# Patient Record
Sex: Female | Born: 1975 | Race: Black or African American | Hispanic: No | Marital: Single | Smoking: Never smoker
Health system: Southern US, Community
[De-identification: ages and names within clinical notes are randomized; demographics above are authoritative.]

## PROBLEM LIST (undated history)

## (undated) DIAGNOSIS — K219 Gastro-esophageal reflux disease without esophagitis: Secondary | ICD-10-CM

## (undated) DIAGNOSIS — G4730 Sleep apnea, unspecified: Secondary | ICD-10-CM

## (undated) DIAGNOSIS — I2699 Other pulmonary embolism without acute cor pulmonale: Secondary | ICD-10-CM

## (undated) DIAGNOSIS — B379 Candidiasis, unspecified: Secondary | ICD-10-CM

## (undated) HISTORY — DX: Sleep apnea, unspecified: G47.30

## (undated) HISTORY — DX: Candidiasis, unspecified: B37.9

## (undated) HISTORY — PX: WISDOM TOOTH EXTRACTION: SHX21

## (undated) HISTORY — DX: Gastro-esophageal reflux disease without esophagitis: K21.9

---

## 1998-10-04 ENCOUNTER — Emergency Department (HOSPITAL_COMMUNITY): Admission: EM | Admit: 1998-10-04 | Discharge: 1998-10-04 | Payer: Self-pay | Admitting: *Deleted

## 1999-09-18 ENCOUNTER — Emergency Department (HOSPITAL_COMMUNITY): Admission: EM | Admit: 1999-09-18 | Discharge: 1999-09-18 | Payer: Self-pay | Admitting: Emergency Medicine

## 2000-11-14 ENCOUNTER — Emergency Department (HOSPITAL_COMMUNITY): Admission: EM | Admit: 2000-11-14 | Discharge: 2000-11-14 | Payer: Self-pay | Admitting: Emergency Medicine

## 2000-12-31 ENCOUNTER — Encounter: Payer: Self-pay | Admitting: Emergency Medicine

## 2000-12-31 ENCOUNTER — Emergency Department (HOSPITAL_COMMUNITY): Admission: EM | Admit: 2000-12-31 | Discharge: 2000-12-31 | Payer: Self-pay | Admitting: Emergency Medicine

## 2001-05-21 ENCOUNTER — Emergency Department (HOSPITAL_COMMUNITY): Admission: EM | Admit: 2001-05-21 | Discharge: 2001-05-21 | Payer: Self-pay | Admitting: Emergency Medicine

## 2001-10-14 ENCOUNTER — Emergency Department (HOSPITAL_COMMUNITY): Admission: EM | Admit: 2001-10-14 | Discharge: 2001-10-15 | Payer: Self-pay | Admitting: Emergency Medicine

## 2002-12-01 ENCOUNTER — Inpatient Hospital Stay (HOSPITAL_COMMUNITY): Admission: AD | Admit: 2002-12-01 | Discharge: 2002-12-01 | Payer: Self-pay | Admitting: Obstetrics and Gynecology

## 2003-08-22 ENCOUNTER — Emergency Department (HOSPITAL_COMMUNITY): Admission: EM | Admit: 2003-08-22 | Discharge: 2003-08-22 | Payer: Self-pay | Admitting: Emergency Medicine

## 2003-10-31 ENCOUNTER — Emergency Department (HOSPITAL_COMMUNITY): Admission: EM | Admit: 2003-10-31 | Discharge: 2003-10-31 | Payer: Self-pay | Admitting: Emergency Medicine

## 2003-11-13 ENCOUNTER — Emergency Department (HOSPITAL_COMMUNITY): Admission: EM | Admit: 2003-11-13 | Discharge: 2003-11-14 | Payer: Self-pay | Admitting: Emergency Medicine

## 2004-06-28 ENCOUNTER — Emergency Department (HOSPITAL_COMMUNITY): Admission: EM | Admit: 2004-06-28 | Discharge: 2004-06-28 | Payer: Self-pay | Admitting: Emergency Medicine

## 2008-01-19 ENCOUNTER — Emergency Department (HOSPITAL_COMMUNITY): Admission: EM | Admit: 2008-01-19 | Discharge: 2008-01-19 | Payer: Self-pay | Admitting: Family Medicine

## 2008-07-15 ENCOUNTER — Emergency Department (HOSPITAL_COMMUNITY): Admission: EM | Admit: 2008-07-15 | Discharge: 2008-07-15 | Payer: Self-pay | Admitting: Emergency Medicine

## 2009-02-02 ENCOUNTER — Emergency Department (HOSPITAL_COMMUNITY): Admission: EM | Admit: 2009-02-02 | Discharge: 2009-02-02 | Payer: Self-pay | Admitting: Family Medicine

## 2009-02-13 ENCOUNTER — Emergency Department (HOSPITAL_COMMUNITY): Admission: EM | Admit: 2009-02-13 | Discharge: 2009-02-13 | Payer: Self-pay | Admitting: Emergency Medicine

## 2010-08-28 ENCOUNTER — Inpatient Hospital Stay (HOSPITAL_COMMUNITY)
Admission: AD | Admit: 2010-08-28 | Discharge: 2010-08-28 | Disposition: A | Discharge status: Home / Self Care | Payer: Managed Care, Other (non HMO) | Source: Ambulatory Visit | Attending: Obstetrics and Gynecology | Admitting: Obstetrics and Gynecology

## 2010-08-28 DIAGNOSIS — B3731 Acute candidiasis of vulva and vagina: Secondary | ICD-10-CM

## 2010-08-28 DIAGNOSIS — B373 Candidiasis of vulva and vagina: Secondary | ICD-10-CM

## 2010-08-28 DIAGNOSIS — N949 Unspecified condition associated with female genital organs and menstrual cycle: Secondary | ICD-10-CM | POA: Insufficient documentation

## 2010-08-28 MED ORDER — FLUCONAZOLE 150 MG PO TABS: 150.0000 mg | ORAL_TABLET | Freq: Once | ORAL | Status: AC

## 2010-08-28 NOTE — Progress Notes (Signed)
By Lilyan Punt NP

## 2010-08-28 NOTE — Progress Notes (Signed)
Pt has been with current partner past 6 mos.  Everytime they have intercourse, they are using condoms, and she has been seen and treated 5 times for yeast and BV.  Sees a doctor in [facility].  Currently having same discharge as has been treated before.  This has not occurred with previous partners when using condoms.

## 2010-08-28 NOTE — Progress Notes (Signed)
Vaginal discharge no odor, clumpy in nature patient states "this happens every time has intercourse with her fiance:,

## 2010-08-28 NOTE — ED Provider Notes (Signed)
Chief Complaint  Patient presents with  . Vaginal Discharge   S:  35 yo nulligravida with thick white pruritic vaginal discharge x several days. Last intercourse a wk ago and states over the past 6 months she has had about 5 yeast infections and also has BV tx from her primary NP. Has tried OTC creams without help, but not used with current episode. No hx DM. Had neg GC. CT recently and has had  No new sex partner. Partner asymptomatic for STI.  O: Filed Vitals:   08/28/10 1302  BP: 140/75  Pulse: 74  Temp: 98.4 F (36.9 C)  Resp: 18   No past medical history on file.  Pelvic: NEFG, Vag pink not inflamed, lg amt thick white adherent discharge.  Cx nulliparous,  without lesions No CMT. Uterus NSSP. No adnexal tenderness or masses.   Results for orders placed during the hospital encounter of 08/28/10 (from the past 24 hour(s))  POCT PREGNANCY, URINE     Status: Normal   Collection Time   08/28/10  1:11 PM      Component Value Range   Preg Test, Ur NEGATIVE    POCT PREGNANCY, URINE     Status: Normal   Collection Time   08/28/10  2:04 PM      Component Value Range   Preg Test, Ur NEGATIVE    WET PREP, GENITAL     Status: Abnormal   Collection Time   08/28/10  2:35 PM      Component Value Range   Yeast, Wet Prep NONE SEEN  NONE SEEN    Trich, Wet Prep NONE SEEN  NONE SEEN    Clue Cells, Wet Prep NONE SEEN  NONE SEEN    WBC, Wet Prep HPF POC MODERATE (*) NONE SEEN   GLUCOSE, CAPILLARY     Status: Normal   Collection Time   08/28/10  2:51 PM      Component Value Range   Glucose-Capillary 87  70 - 99 (mg/dL)    A: Clinical yeast infection, recurrent   P: DIflucan rx with refills and discussed measures to acidify vagina - Acigel, boric acid.

## 2011-10-02 ENCOUNTER — Ambulatory Visit (INDEPENDENT_AMBULATORY_CARE_PROVIDER_SITE_OTHER): Payer: Managed Care, Other (non HMO) | Admitting: Obstetrics and Gynecology

## 2011-10-02 ENCOUNTER — Encounter: Payer: Self-pay | Admitting: Obstetrics and Gynecology

## 2011-10-02 VITALS — BP 118/78 | Wt 213.0 lb

## 2011-10-02 DIAGNOSIS — Z01419 Encounter for gynecological examination (general) (routine) without abnormal findings: Secondary | ICD-10-CM

## 2011-10-02 DIAGNOSIS — R109 Unspecified abdominal pain: Secondary | ICD-10-CM

## 2011-10-02 NOTE — Progress Notes (Signed)
NEW GYNECOLOGIC EXAMINATION  Ms. Theresa Riley is an 36 y.o. female, G2P0020, who presents to the [facility] division of [facility] for a new patient gynecologic examination. She is doing well in general.  Her complains AVW:[id number] cycle last month and dysmenorrhea. .     Pertinent Gynecological History: Patient's last menstrual period was 09/19/2011. Menses: flow is moderate Menarche: 13 Contraception: abstinence DES exposure: unknown Blood transfusions: none Sexually transmitted diseases: The patient denies history of sexually transmitted disease. Previous GYN Procedures: DNC  Last mammogram: not applicable Last pap: normal Date: August 2012. History of Abnormal Pap Smears:  No  Obstetrical History:  Vaginal Deliveries at Term:      0 Preterm Vaginal Deliveries:      0 Cesarean Deliveries at Term:  0 Preterm Cesareans:                 0 Miscarriages:                            0 Abortions:                                  2    Past Medical History  Diagnosis Date  . Yeast infection     Past Surgical History  Procedure Date  . Wisdom tooth extraction     Family History  Problem Relation Age of Onset  . Diabetes Mother     Social History:  reports that she has never smoked. She has never used smokeless tobacco. She reports that she does not drink alcohol or use illicit drugs.  Allergies: No Known Allergies  Medications: none  Review of Systems:  See history of present illness and gynecologic history.  Family History: Non-insulin-dependent diabetes in mother.  Physical Examination:  Blood pressure 118/78, weight 213 lb (96.616 kg), last menstrual period 09/19/2011. There is no height on file to calculate BMI.  General: alert and no distress Resp: clear to auscultation bilaterally Cardio: regular rate and rhythm, S1, S2 normal, no murmur, click, rub or gallop GI: soft, non-tender; bowel sounds normal; no masses,  no  organomegaly Breast: No masses, skin changes, or discharge  External genitalia: normal general appearance Vagina: No masses or lesions, relaxation: no nontender skin protrusion from right vaginal wall. Cervix: Nontender, No lesions Uterus: Normal size, shape, and consistency Adnexa: No masses, nontender Rectovaginal exam:Confirms  No results found for this or any previous visit (from the past 48 hour(s)).  Wet Prep: None  Urine Pregnancy Test: None  Assessment:  Normal female examination  Overweight or obese: Yes   Pelvic relaxation: No  Benign vaginal anomaly  Irregular cycle last month  Dysmenorrhea   Plan:    Return to office in 6 weeks.  Renal scan to document and normal renal collection system because of the possibility that her vaginal lesion may be a mllerian remnant.  Contraception:abstinence   STD screen request:  Yes ; Gonorrhea and Chlamydia  The updated Pap smear screening guidelines were discussed with the patient. The patient requested that I obtain a Pap smear: No.  Kegel exercises discussed: Yes.  Proper diet and regular exercise were reviewed.  Annual mammograms recommended starting at age 6. Proper breast care was discussed.  Screening colonoscopy is recommended beginning at age 33.  Regular health maintenance was reviewed.  Sleep hygiene was discussed.  Adequate calcium and vitamin  D intake was emphasized.  Medications Prescribed:  none  Leonard Schwartz, M.D. 10/02/2011    Vag. Discharge:no Odor:no Fever:no Irreg.Periods:yes Dyspareunia:no Dysuria:no Frequency:no Urgency:no Hematuria:no Kidney stones:no Constipation:no Diarrhea:no Rectal Bleeding: no Vomiting:no Nausea:no Pregnant:yes Fibroids:no Endometriosis:no Hx of Ovarian Cyst:no  Hx IUD:no Hx STD-PID:no Appendectomy:yes Gall Bladder Dz:yes Pt stated her last pap was august 2012 wnl per pt

## 2011-10-03 ENCOUNTER — Other Ambulatory Visit: Payer: Self-pay

## 2011-10-03 ENCOUNTER — Telehealth: Payer: Self-pay

## 2011-10-03 LAB — GC/CHLAMYDIA PROBE AMP, GENITAL: GC Probe Amp, Genital: NEGATIVE

## 2011-10-03 NOTE — Telephone Encounter (Signed)
Radiology suggests that we order a CT scan of the abdomen and pelvis with and without contrast.  Dr. Stefano Gaul

## 2011-10-03 NOTE — Telephone Encounter (Signed)
Per AVS pt needs IVP. [facility] stated they do not do this procedure anymore and that the pt may have a CT done. [facility] does do IVP procedures I made the aware of Ms.Uplinger condition and stated what AVS clinical note read. [facility] stated that they would like for. Dr.Stringer to call And discuss to be sure they preform the correct procedure. Dr.Stringer  will need to speak to radiologist  Dr.Dover @ [phone].

## 2011-10-04 ENCOUNTER — Telehealth: Payer: Self-pay

## 2011-10-04 ENCOUNTER — Other Ambulatory Visit: Payer: Self-pay

## 2011-10-04 DIAGNOSIS — R109 Unspecified abdominal pain: Secondary | ICD-10-CM

## 2011-10-04 DIAGNOSIS — N946 Dysmenorrhea, unspecified: Secondary | ICD-10-CM

## 2011-10-04 NOTE — Telephone Encounter (Signed)
Per AVS after speaking to a radiologist A CT scan would be better. A Future order was placed for a CT abdomen W WO CONTRAST. Appt was scheduled for Thurs 10-05-11 @ 3:15pm. Pt is to pick up contrast die 24hrs before procedure to put in the fridge. and not eat any solid foods 4hrs before procedure. Pt was made aware of appt and stated she may need to change the date. Pt was given [facility] [address] # to R/S appt. Pt voiced understanding.  [facility] CMA

## 2011-10-05 ENCOUNTER — Other Ambulatory Visit: Payer: Self-pay

## 2011-10-05 ENCOUNTER — Other Ambulatory Visit: Payer: Managed Care, Other (non HMO)

## 2011-10-05 DIAGNOSIS — R109 Unspecified abdominal pain: Secondary | ICD-10-CM

## 2011-10-06 ENCOUNTER — Other Ambulatory Visit: Payer: Managed Care, Other (non HMO)

## 2011-10-06 ENCOUNTER — Ambulatory Visit
Admission: RE | Admit: 2011-10-06 | Discharge: 2011-10-06 | Disposition: A | Discharge status: Home / Self Care | Payer: Managed Care, Other (non HMO) | Source: Ambulatory Visit | Attending: Obstetrics and Gynecology | Admitting: Obstetrics and Gynecology

## 2011-10-06 ENCOUNTER — Inpatient Hospital Stay: Admission: RE | Admit: 2011-10-06 | Payer: Managed Care, Other (non HMO) | Source: Ambulatory Visit

## 2011-10-06 ENCOUNTER — Telehealth: Payer: Self-pay

## 2011-10-06 DIAGNOSIS — R109 Unspecified abdominal pain: Secondary | ICD-10-CM

## 2011-10-06 MED ORDER — IOHEXOL 300 MG/ML  SOLN: 125.0000 mL | Freq: Once | INTRAMUSCULAR | Status: AC | PRN

## 2011-10-06 NOTE — Telephone Encounter (Signed)
Marisue Ivan from [facility] called and stated the first order For Theresa Riley CT stated she was pregnant But the second order stated she is not pregnant. I called Ms.Schimek today this am and she confirmed she is NOT pregnant. I called Marisue Ivan back at [facility] and confirmed the the pt is not pregnant.   [facility] CMA

## 2011-10-12 ENCOUNTER — Encounter: Payer: Self-pay | Admitting: Obstetrics and Gynecology

## 2011-10-12 ENCOUNTER — Ambulatory Visit (INDEPENDENT_AMBULATORY_CARE_PROVIDER_SITE_OTHER): Payer: Managed Care, Other (non HMO) | Admitting: Obstetrics and Gynecology

## 2011-10-12 VITALS — BP 104/64 | Ht 65.0 in | Wt 219.0 lb

## 2011-10-12 DIAGNOSIS — N898 Other specified noninflammatory disorders of vagina: Secondary | ICD-10-CM

## 2011-10-12 LAB — POCT WET PREP (WET MOUNT): Bacteria Wet Prep HPF POC: NEGATIVE

## 2011-10-12 NOTE — Progress Notes (Signed)
C/o vag d/c on Mon.  Menses last month came 2wks later.  Filed Vitals:   10/12/11 1612  BP: 104/64   ROS: noncontributory  Pelvic exam:  VULVA: normal appearing vulva with no masses, tenderness or lesions,  VAGINA: normal appearing vagina with normal color and discharge, no lesions, CERVIX: normal appearing cervix without discharge or lesions, light blood on cervix UTERUS: uterus is normal size, shape, consistency and nontender,  ADNEXA: normal adnexa in size, nontender and no masses.  Results for orders placed in visit on 10/02/11  GC/CHLAMYDIA PROBE AMP, GENITAL      Component Value Range   Chlamydia, DNA Probe NEGATIVE     GC Probe Amp, Genital NEGATIVE     Results for orders placed in visit on 10/12/11  POCT WET PREP (WET MOUNT)      Component Value Range   Source Wet Prep POC       WBC, Wet Prep HPF POC neg     Bacteria Wet Prep HPF POC neg     BACTERIA WET PREP MORPHOLOGY POC       Clue Cells Wet Prep HPF POC None     CLUE CELLS WET PREP WHIFF POC Negative Whiff     Yeast Wet Prep HPF POC None     KOH Wet Prep POC       Trichomonas Wet Prep HPF POC neg     pH 4.0      A/P Wet prep - neg Beg of yr for AEX Nl appearing d/c, menses about to start

## 2011-11-15 ENCOUNTER — Encounter: Payer: Self-pay | Admitting: Obstetrics and Gynecology

## 2011-11-15 ENCOUNTER — Ambulatory Visit (INDEPENDENT_AMBULATORY_CARE_PROVIDER_SITE_OTHER): Payer: Managed Care, Other (non HMO) | Admitting: Obstetrics and Gynecology

## 2011-11-15 VITALS — BP 110/78 | Wt 219.0 lb

## 2011-11-15 DIAGNOSIS — R109 Unspecified abdominal pain: Secondary | ICD-10-CM

## 2011-11-15 NOTE — Progress Notes (Signed)
HISTORY OF PRESENT ILLNESS  Ms. Theresa Riley is a 36 y.o. year old female,G2P0020, who presents for a problem visit.   The patient was noted to have a soft-tissue vaginal lesion that was suspicious for mllerian abnormality.  A CT scan showed a normal urinary tract, normal pelvic organs, and normal abdominal organs.  See report below. She was evaluated for vaginal discharge and that has now resolved.  Subjective:  The patient complained of abdominal pain last visit and that has now resolved.  Objective:  BP 110/78  Wt 219 lb (99.338 kg)  LMP 11/06/2011   General: no distress  Exam deferred.  Assessment:  Soft-tissue vaginal lesion that may represent a mllerian abnormality.  Normal urinary tract structures and normal pelvic and abdominal organs.  Improved abdominal pain.  Plan:  Observe her only for now.  Return to office prn if symptoms worsen or fail to improve.   Leonard Schwartz M.D.  11/15/2011 1:51 PM    F/u CT scan RADIOLOGY REPORT*  Clinical Data: Lower abdomen pain for 1 month  CT ABDOMEN AND PELVIS WITH CONTRAST  Technique: Multidetector CT imaging of the abdomen and pelvis was  performed following the standard protocol during bolus  administration of intravenous contrast.  Contrast: 125 ml Omnipaque-300  Comparison: None.  Findings: The lung bases are clear. The liver enhances with no  focal abnormality and no ductal dilatation is seen. A probable  tiny cyst is noted in the periphery of the right lobe of doubtful  significance. No calcified gallstones are seen. The pancreas is  normal in size and the pancreatic duct is not dilated. The adrenal  glands and spleen are unremarkable. The stomach is moderately  fluid distended with no abnormality noted. The kidneys enhance  with no calculus or mass and no hydronephrosis is seen. The  abdominal aorta is normal in caliber. No adenopathy is seen.  The appendix is well visualized, extending  cephalad, and filling  with air. No evidence of appendicitis is seen. The terminal ileum  is unremarkable. The urinary bladder is not well distended but no  abnormality is seen. The uterus is normal in size. No adnexal  lesion is noted. No free fluid is seen. No abnormality of the  colon is noted. The lumbar vertebrae are in normal alignment. No  bony abnormality is seen.  IMPRESSION:  1. No explanation for the patient's pain is seen.  2. The appendix and terminal ileum appear normal.

## 2012-02-14 DIAGNOSIS — I2699 Other pulmonary embolism without acute cor pulmonale: Secondary | ICD-10-CM | POA: Insufficient documentation

## 2012-02-14 DIAGNOSIS — Z86711 Personal history of pulmonary embolism: Secondary | ICD-10-CM | POA: Diagnosis present

## 2012-06-01 ENCOUNTER — Emergency Department (HOSPITAL_COMMUNITY): Payer: Managed Care, Other (non HMO)

## 2012-06-01 ENCOUNTER — Inpatient Hospital Stay (HOSPITAL_COMMUNITY)
Admission: EM | Admit: 2012-06-01 | Discharge: 2012-06-03 | DRG: 176 | Disposition: A | Discharge status: Home / Self Care | Payer: Managed Care, Other (non HMO) | Attending: Internal Medicine | Admitting: Internal Medicine

## 2012-06-01 DIAGNOSIS — I2699 Other pulmonary embolism without acute cor pulmonale: Principal | ICD-10-CM | POA: Diagnosis present

## 2012-06-01 DIAGNOSIS — Z86711 Personal history of pulmonary embolism: Secondary | ICD-10-CM | POA: Diagnosis present

## 2012-06-01 DIAGNOSIS — R0602 Shortness of breath: Secondary | ICD-10-CM | POA: Diagnosis present

## 2012-06-01 LAB — BASIC METABOLIC PANEL
BUN: 10 mg/dL (ref 6–23)
CO2: 25 mEq/L (ref 19–32)
Chloride: 103 mEq/L (ref 96–112)
Creatinine, Ser: 0.76 mg/dL (ref 0.50–1.10)
Glucose, Bld: 96 mg/dL (ref 70–99)
Sodium: 136 mEq/L (ref 135–145)

## 2012-06-01 LAB — CBC: HCT: 33.2 % — ABNORMAL LOW (ref 36.0–46.0)

## 2012-06-01 NOTE — ED Notes (Signed)
New EKG given to Dr Ignacia Palma. No old EKG .

## 2012-06-01 NOTE — ED Provider Notes (Signed)
History     CSN: [id number]  Arrival date & time 06/01/12  2055   First MD Initiated Contact with Patient 06/01/12 2159      Chief Complaint  Patient presents with  . Shortness of Breath    (Consider location/radiation/quality/duration/timing/severity/associated sxs/prior treatment) Patient is a 37 y.o. female presenting with shortness of breath. The history is provided by the patient.  Shortness of Breath Associated symptoms: no abdominal pain, no chest pain, no cough, no fever, no headaches, no neck pain, no rash and no vomiting   pt c/o mild sob w exertion in past week. States not consistent dyspnea w exertion but occasionally will feel mildly winded going up steps. Denies any current or recent chest pain or discomfort. No hx asthma or lung disease. No hx environmental or seasonal allergies. No recent med use. Non smoker. No drug use. No family hx cad. Pt denies any recent wt change or swelling. No orthopnea or pnd. no palpitations or sense of rapid or irregular heart beat. Denies hx dvt or pe. No recent immobility, trauma, prolonged travel or recent surgery preceding symptoms (pt did come to [place] from her home in [place] yesterday to visit family). Pt denies any recent blood loss, rectal bleeding, melena, or vaginal bleeding. Notes long hx periods on heavy side, but notes no recent change in periods and is having no vaginal bleeding now. Denies fever or chills. No recent cough or uri symptoms.     Past Medical History  Diagnosis Date  . Yeast infection     Past Surgical History  Procedure Laterality Date  . Wisdom tooth extraction      Family History  Problem Relation Age of Onset  . Diabetes Mother     History  Substance Use Topics  . Smoking status: Never Smoker   . Smokeless tobacco: Never Used  . Alcohol Use: No    OB History   Grav Para Term Preterm Abortions TAB SAB Ect Mult Living   2    2           Review of Systems  Constitutional: Negative for  fever and chills.  HENT: Negative for neck pain.   Eyes: Negative for redness.  Respiratory: Positive for shortness of breath. Negative for cough.   Cardiovascular: Negative for chest pain and leg swelling.  Gastrointestinal: Negative for nausea, vomiting, abdominal pain and blood in stool.  Genitourinary: Negative for dysuria, flank pain and vaginal bleeding.  Musculoskeletal: Negative for back pain.  Skin: Negative for rash.  Neurological: Negative for weakness, numbness and headaches.  Hematological: Does not bruise/bleed easily.  Psychiatric/Behavioral: Negative for confusion.    Allergies  Review of patient's allergies indicates no known allergies.  Home Medications   Current Outpatient Rx  Name  Route  Sig  Dispense  Refill  . naproxen sodium (ANAPROX) 220 MG tablet   Oral   Take 220 mg by mouth daily as needed (for headache).           BP 156/107  Pulse 110  Temp(Src) 98.5 F (36.9 C) (Oral)  Resp 18  SpO2 100%  LMP 05/18/2012  Physical Exam  Nursing note and vitals reviewed. Constitutional: She is oriented to person, place, and time. She appears well-developed and well-nourished. No distress.  HENT:  Nose: Nose normal.  Mouth/Throat: Oropharynx is clear and moist.  Eyes: Conjunctivae are normal. No scleral icterus.  Neck: Neck supple. No tracheal deviation present. No thyromegaly present.  Cardiovascular: Normal rate, regular rhythm, normal heart  sounds and intact distal pulses.  Exam reveals no gallop and no friction rub.   No murmur heard. Pulmonary/Chest: Effort normal and breath sounds normal. No respiratory distress.  Abdominal: Soft. Normal appearance and bowel sounds are normal. She exhibits no distension and no mass. There is no tenderness. There is no rebound and no guarding.  Musculoskeletal: She exhibits no edema and no tenderness.  Neurological: She is alert and oriented to person, place, and time.  Skin: Skin is warm and dry. No rash noted.   Psychiatric: She has a normal mood and affect.    ED Course  Procedures (including critical care time)   Results for orders placed during the hospital encounter of 06/01/12  CBC      Result Value Range   WBC 5.9  4.0 - 10.5 K/uL   RBC 3.95  3.87 - 5.11 MIL/uL   Hemoglobin 11.2 (*) 12.0 - 15.0 g/dL   HCT 16.1 (*) 09.6 - 04.5 %   MCV 84.1  78.0 - 100.0 fL   MCH 28.4  26.0 - 34.0 pg   MCHC 33.7  30.0 - 36.0 g/dL   RDW 40.9  81.1 - 91.4 %   Platelets 306  150 - 400 K/uL  BASIC METABOLIC PANEL      Result Value Range   Sodium 136  135 - 145 mEq/L   Potassium 3.5  3.5 - 5.1 mEq/L   Chloride 103  96 - 112 mEq/L   CO2 25  19 - 32 mEq/L   Glucose, Bld 96  70 - 99 mg/dL   BUN 10  6 - 23 mg/dL   Creatinine, Ser 7.82  0.50 - 1.10 mg/dL   Calcium 9.0  8.4 - 95.6 mg/dL   GFR calc non Af Amer >90  >90 mL/min   GFR calc Af Amer >90  >90 mL/min  PRO B NATRIURETIC PEPTIDE      Result Value Range   Pro B Natriuretic peptide (BNP) <5.0  0 - 125 pg/mL  D-DIMER, QUANTITATIVE      Result Value Range   D-Dimer, Quant 5.79 (*) 0.00 - 0.48 ug/mL-FEU  POCT I-STAT TROPONIN I      Result Value Range   Troponin i, poc 0.01  0.00 - 0.08 ng/mL   Comment 3            Dg Chest 2 View  06/01/2012  *RADIOLOGY REPORT*  Clinical Data: Shortness of breath for 4 days.  CHEST - 2 VIEW  Comparison: Chest radiograph performed 02/13/2009  Findings: The lungs are well-aerated and clear.  There is no evidence of focal opacification, pleural effusion or pneumothorax.  The heart is normal in size; the mediastinal contour is within normal limits.  No acute osseous abnormalities are seen.  IMPRESSION: No acute cardiopulmonary process seen.   Original Report Authenticated By: Tonia Ghent, M.D.       MDM  Labs. Cxr.  Pt notes hx heavy periods, denies any recent increase in period bleeding, and is having no bleeding currently.  cxr neg. Pulse ox 100% room air.    Date: 06/01/2012  Rate: 77  Rhythm: normal  sinus rhythm  QRS Axis: normal  Intervals: normal  ST/T Wave abnormalities: nonspecific T wave changes  Conduction Disutrbances:none  Narrative Interpretation:   Old EKG Reviewed: none available   After symptoms for several days, trop neg.  ddimer is very high - will get ct chest rule out PE.  Discussed plan for ct w pt.  Pt signed out to Dr Lavella Lemons to check ct angio chest when back, reassess pt, and dispo appropriately.          Suzi Roots, MD 06/01/12 2312

## 2012-06-01 NOTE — ED Notes (Addendum)
Pt states she is getting winded and sometimes light headed with exertion. Denies cough, fever, chills, pain.

## 2012-06-01 NOTE — ED Notes (Signed)
Pt states that she only breathes heavily when walking at a fast pace or up stairs.

## 2012-06-02 ENCOUNTER — Emergency Department (HOSPITAL_COMMUNITY): Payer: Managed Care, Other (non HMO)

## 2012-06-02 ENCOUNTER — Encounter (HOSPITAL_COMMUNITY): Payer: Self-pay | Admitting: *Deleted

## 2012-06-02 DIAGNOSIS — I2699 Other pulmonary embolism without acute cor pulmonale: Secondary | ICD-10-CM

## 2012-06-02 DIAGNOSIS — Z86711 Personal history of pulmonary embolism: Secondary | ICD-10-CM | POA: Diagnosis present

## 2012-06-02 DIAGNOSIS — R0602 Shortness of breath: Secondary | ICD-10-CM | POA: Diagnosis present

## 2012-06-02 LAB — APTT: aPTT: 41 seconds — ABNORMAL HIGH (ref 24–37)

## 2012-06-02 LAB — COMPREHENSIVE METABOLIC PANEL
AST: 16 U/L (ref 0–37)
CO2: 27 mEq/L (ref 19–32)
Calcium: 8.8 mg/dL (ref 8.4–10.5)
Chloride: 104 mEq/L (ref 96–112)
Creatinine, Ser: 0.7 mg/dL (ref 0.50–1.10)
GFR calc Af Amer: >90 mL/min (ref >90)
GFR calc non Af Amer: >90 mL/min (ref >90)
Glucose, Bld: 103 mg/dL — ABNORMAL HIGH (ref 70–99)
Total Bilirubin: 0.2 mg/dL — ABNORMAL LOW (ref 0.3–1.2)

## 2012-06-02 LAB — CBC
MCH: 28 pg (ref 26.0–34.0)
MCHC: 33.4 g/dL (ref 30.0–36.0)
MCV: 83.6 fL (ref 78.0–100.0)
Platelets: 288 10*3/uL (ref 150–400)
RBC: 3.72 MIL/uL — ABNORMAL LOW (ref 3.87–5.11)
RDW: 14.7 % (ref 11.5–15.5)

## 2012-06-02 LAB — TROPONIN I
Troponin I: <0.3 ng/mL (ref <0.30)
Troponin I: <0.3 ng/mL (ref <0.30)

## 2012-06-02 LAB — MRSA PCR SCREENING: MRSA by PCR: NEGATIVE

## 2012-06-02 MED ORDER — WARFARIN SODIUM 10 MG PO TABS
10.0000 mg | ORAL_TABLET | Freq: Once | ORAL | Status: AC
  Administered 2012-06-02: 10 mg via ORAL
  Filled 2012-06-02: qty 1

## 2012-06-02 MED ORDER — HYDROCODONE-ACETAMINOPHEN 5-325 MG PO TABS: 1.0000 | ORAL_TABLET | Freq: Four times a day (QID) | ORAL | Status: DC | PRN

## 2012-06-02 MED ORDER — WARFARIN - PHARMACIST DOSING INPATIENT: Freq: Every day | Status: DC

## 2012-06-02 MED ORDER — IOHEXOL 350 MG/ML SOLN
100.0000 mL | Freq: Once | INTRAVENOUS | Status: AC | PRN
  Administered 2012-06-02: 100 mL via INTRAVENOUS

## 2012-06-02 MED ORDER — COUMADIN BOOK
Freq: Once | Status: DC
  Filled 2012-06-02: qty 1

## 2012-06-02 MED ORDER — ENOXAPARIN SODIUM 100 MG/ML ~~LOC~~ SOLN
100.0000 mg | Freq: Once | SUBCUTANEOUS | Status: AC
  Administered 2012-06-02: 100 mg via SUBCUTANEOUS
  Filled 2012-06-02: qty 1

## 2012-06-02 MED ORDER — SODIUM CHLORIDE 0.9 % IV SOLN
INTRAVENOUS | Status: AC
  Administered 2012-06-02: 75 mL/h via INTRAVENOUS

## 2012-06-02 MED ORDER — WARFARIN VIDEO
Freq: Once | Status: AC
  Administered 2012-06-02: 12:00:00

## 2012-06-02 MED ORDER — ENOXAPARIN SODIUM 100 MG/ML ~~LOC~~ SOLN
1.0000 mg/kg | Freq: Two times a day (BID) | SUBCUTANEOUS | Status: DC
  Administered 2012-06-02 (×2): 95 mg via SUBCUTANEOUS
  Filled 2012-06-02 (×4): qty 1

## 2012-06-02 NOTE — Progress Notes (Signed)
Pt report called to [place], RN on 2000.  Pt transferred via w/c, IV, tele.  Salomon Mast, RN

## 2012-06-02 NOTE — Progress Notes (Signed)
ANTICOAGULATION CONSULT NOTE - Initial Consult  Pharmacy Consult for Coumadin Indication: pulmonary embolus  No Known Allergies  Patient Measurements: Weight: 212 lb (96.163 kg)  Vital Signs: Temp: 98.7 F (37.1 C) (04/19 2251) Temp src: Oral (04/19 2251) BP: 139/93 mmHg (04/20 0100) Pulse Rate: 78 (04/20 0100)  Labs:  Recent Labs  06/01/12 2133  HGB 11.2*  HCT 33.2*  PLT 306  CREATININE 0.76    The CrCl is unknown because both a height and weight (above a minimum accepted value) are required for this calculation.   Medical History: Past Medical History  Diagnosis Date  . Yeast infection     Medications:  Prescriptions prior to admission  Medication Sig Dispense Refill  . naproxen sodium (ANAPROX) 220 MG tablet Take 220 mg by mouth daily as needed (for headache).        Assessment: 37 yo female with PE for anticoagulation  Lovenox 95 mg SQ q12h ordered by MD.  Goal of Therapy:  INR 2-3 Monitor platelets by anticoagulation protocol: Yes   Plan:  Coumadin 10 mg now and tonight at 6pm Daily INR  Theresa Riley, Theresa Riley 06/02/2012,2:39 AM

## 2012-06-02 NOTE — ED Provider Notes (Signed)
I assumed care of Ms. Ohern at shift change from Dr. Denton Lank. CT chest shows bilateral pulmonary emboli. Diagnosis discussed with patient. Hypercoagulable panel and first dose of Lovenox [place] ordered. Case discussed with Dr. Onalee Hua who will admit the patient to step down. Patient remains hemodynamically stable.   Brandt Loosen, MD 06/02/12 [phone]

## 2012-06-02 NOTE — Progress Notes (Signed)
[facility] PROGRESS NOTE Assessment/Plan:  SOB (shortness of breath)/Pulmonary embolism, bilateral - Ct angio 4.19.2014: Pulmonary embolus extending to all lobes of both lungs - Lovenox and coumadin - lower ext DVT. - BP stable    Code Status: full Family Communication: none  Disposition Plan: home in 1-2 days.   Consultants:  none  Procedures:  Ct angio chest: acute large PE.  Antibiotics:  None  HPI/Subjective: Sob improve  Objective: Filed Vitals:   06/02/12 0215 06/02/12 0400 06/02/12 0500 06/02/12 0600  BP: 143/89 122/79 125/79 120/80  Pulse: 80 94 83 87  Temp: 97.8 F (36.6 C)     TempSrc: Oral     Resp: 22 25 23 18   Height: 5\' 5"  (1.651 m)     Weight: 95.5 kg (210 lb 8.6 oz)     SpO2: 99% 95% 93% 97%    Intake/Output Summary (Last 24 hours) at 06/02/12 0755 Last data filed at 06/02/12 0600  Gross per 24 hour  Intake 236.25 ml  Output    400 ml  Net -163.75 ml   Filed Weights   06/02/12 0115 06/02/12 0215  Weight: 96.163 kg (212 lb) 95.5 kg (210 lb 8.6 oz)    Exam:  General: Alert, awake, oriented x3, in no acute distress.  HEENT: No bruits, no goiter.  Heart: Regular rate and rhythm, without murmurs, rubs, gallops.  Lungs: Good air movement, clear to auscultation.  Abdomen: Soft, nontender, nondistended, positive bowel sounds.  Neuro: Grossly intact, nonfocal.   Data Reviewed: Basic Metabolic Panel:  Recent Labs Lab 06/01/12 2133 06/02/12 0500  NA 136 138  K 3.5 3.7  CL 103 104  CO2 25 27  GLUCOSE 96 103*  BUN 10 7  CREATININE 0.76 0.70  CALCIUM 9.0 8.8   Liver Function Tests:  Recent Labs Lab 06/02/12 0500  AST 16  ALT 13  ALKPHOS 96  BILITOT 0.2*  PROT 7.1  ALBUMIN 3.3*   No results found for this basename: LIPASE, AMYLASE,  in the last 168 hours No results found for this basename: AMMONIA,  in the last 168 hours CBC:  Recent Labs Lab 06/01/12 2133 06/02/12 0615  WBC 5.9 4.7  HGB 11.2* 10.4*  HCT  33.2* 31.1*  MCV 84.1 83.6  PLT 306 288   Cardiac Enzymes:  Recent Labs Lab 06/02/12 0249  TROPONINI <0.30   BNP (last 3 results)  Recent Labs  06/01/12 2133  PROBNP <5.0   CBG: No results found for this basename: GLUCAP,  in the last 168 hours  Recent Results (from the past 240 hour(s))  MRSA PCR SCREENING     Status: None   Collection Time    06/02/12  3:45 AM      Result Value Range Status   MRSA by PCR NEGATIVE  NEGATIVE Final   Comment:            The GeneXpert MRSA Assay (FDA     approved for NASAL specimens     only), is one component of a     comprehensive MRSA colonization     surveillance program. It is not     intended to diagnose MRSA     infection nor to guide or     monitor treatment for     MRSA infections.     Studies: Dg Chest 2 View  06/01/2012  *RADIOLOGY REPORT*  Clinical Data: Shortness of breath for 4 days.  CHEST - 2 VIEW  Comparison: Chest radiograph performed 02/13/2009  Findings: The lungs are well-aerated and clear.  There is no evidence of focal opacification, pleural effusion or pneumothorax.  The heart is normal in size; the mediastinal contour is within normal limits.  No acute osseous abnormalities are seen.  IMPRESSION: No acute cardiopulmonary process seen.   Original Report Authenticated By: Tonia Ghent, M.D.    Ct Angio Chest Pe W/cm &/or Wo Cm  06/02/2012  *RADIOLOGY REPORT*  Clinical Data: Shortness of breath.  Assess for pulmonary embolus.  CT ANGIOGRAPHY CHEST  Technique:  Multidetector CT imaging of the chest using the standard protocol during bolus administration of intravenous contrast. Multiplanar reconstructed images including MIPs were obtained and reviewed to evaluate the vascular anatomy.  Contrast: OMNIPAQUE IOHEXOL 350 MG/ML SOLN  Comparison: Chest radiograph performed 06/01/2012  Findings: There is pulmonary embolus extending to all lobes of both lungs, primarily segmental and subsegmental in nature, though  pulmonary embolus is seen within the right main pulmonary artery, extending distally.  A relatively large clot burden is noted.  The lungs are essentially clear bilaterally.  There is no evidence of significant focal consolidation, pleural effusion or pneumothorax.  No masses are identified; no abnormal focal contrast enhancement is seen.  The mediastinum is unremarkable in appearance.  No mediastinal lymphadenopathy is seen.  No pericardial effusion is identified. The great vessels are grossly unremarkable in appearance.  No axillary lymphadenopathy is seen.  The visualized portions of the thyroid gland are unremarkable in appearance.  The visualized portions of the liver and spleen are unremarkable. A tiny hiatal hernia is suspected.  The visualized portions of the pancreas, adrenal glands and kidneys are grossly unremarkable.  No acute osseous abnormalities are seen.  IMPRESSION:  1.  Pulmonary embolus extending to all lobes of both lungs, primarily segmental and subsegmental in nature, though also seen within the right main pulmonary artery, extending distally. Relatively large clot burden noted. 2.  Lungs clear bilaterally. 3.  Suspect tiny hiatal hernia.  Critical Value/emergent results were called by telephone at the time of interpretation on 06/02/2012 at 12:23 a.m. to Dr. Brandt Loosen, who verbally acknowledged these results.   Original Report Authenticated By: Tonia Ghent, M.D.     Scheduled Meds: . coumadin book   Does not apply Once  . enoxaparin (LOVENOX) injection  1 mg/kg Subcutaneous Q12H  . warfarin  10 mg Oral ONCE-1800  . warfarin   Does not apply Once  . Warfarin - Pharmacist Dosing Inpatient   Does not apply q1800   Continuous Infusions: . sodium chloride 75 mL/hr (06/02/12 0251)     Marinda Elk  [facility] Pager [phone]. If 8PM-8AM, please contact night-coverage at [email or web address], password [facility] 06/02/2012, 7:55 AM  LOS: 1 day

## 2012-06-02 NOTE — H&P (Signed)
Chief Complaint:  sob  HPI: 37 yo healthy aaf comes in with 5 days of progressive worsening sob, no cp.  No fevers.  No cough.  No le edema or swelling.  Last week she did move here from [place], drove 5 1/2 hours.  Otherwise, no other long trips.  No recent trauma, surgery or illnessess.  No h/o vte.  No fhx of vte.  On no birth control.    Review of Systems:  Positive and negative as per HPI otherwise all other systems are negative  Past Medical History: Past Medical History  Diagnosis Date  . Yeast infection    Past Surgical History  Procedure Laterality Date  . Wisdom tooth extraction      Medications: Prior to Admission medications   Medication Sig Start Date End Date Taking? Authorizing Provider  naproxen sodium (ANAPROX) 220 MG tablet Take 220 mg by mouth daily as needed (for headache).   Yes Historical Provider, MD    Allergies:  No Known Allergies  Social History:  reports that she has never smoked. She has never used smokeless tobacco. She reports that she does not drink alcohol or use illicit drugs.  Family History: Family History  Problem Relation Age of Onset  . Diabetes Mother     Physical Exam: Filed Vitals:   06/01/12 2202 06/01/12 2251 06/02/12 0020 06/02/12 0115  BP:  135/77 140/93   Pulse: 87 82 82   Temp:  98.7 F (37.1 C)    TempSrc:  Oral    Resp: 19 22 22    Weight:    96.163 kg (212 lb)  SpO2: 100% 100% 99%    General appearance: alert, cooperative and no distress Head: Normocephalic, without obvious abnormality, atraumatic Eyes: negative Nose: Nares normal. Septum midline. Mucosa normal. No drainage or sinus tenderness. Neck: no JVD and supple, symmetrical, trachea midline Lungs: clear to auscultation bilaterally Heart: regular rate and rhythm, S1, S2 normal, no murmur, click, rub or gallop Abdomen: soft, non-tender; bowel sounds normal; no masses,  no organomegaly Extremities: extremities normal, atraumatic, no cyanosis or  edema Pulses: 2+ and symmetric Skin: Skin color, texture, turgor normal. No rashes or lesions Neurologic: Grossly normal    Labs on Admission:   Recent Labs  06/01/12 2133  NA 136  K 3.5  CL 103  CO2 25  GLUCOSE 96  BUN 10  CREATININE 0.76  CALCIUM 9.0    Recent Labs  06/01/12 2133  WBC 5.9  HGB 11.2*  HCT 33.2*  MCV 84.1  PLT 306    Radiological Exams on Admission: Dg Chest 2 View  06/01/2012  *RADIOLOGY REPORT*  Clinical Data: Shortness of breath for 4 days.  CHEST - 2 VIEW  Comparison: Chest radiograph performed 02/13/2009  Findings: The lungs are well-aerated and clear.  There is no evidence of focal opacification, pleural effusion or pneumothorax.  The heart is normal in size; the mediastinal contour is within normal limits.  No acute osseous abnormalities are seen.  IMPRESSION: No acute cardiopulmonary process seen.   Original Report Authenticated By: Tonia Ghent, M.D.    Ct Angio Chest Pe W/cm &/or Wo Cm  06/02/2012  *RADIOLOGY REPORT*  Clinical Data: Shortness of breath.  Assess for pulmonary embolus.  CT ANGIOGRAPHY CHEST  Technique:  Multidetector CT imaging of the chest using the standard protocol during bolus administration of intravenous contrast. Multiplanar reconstructed images including MIPs were obtained and reviewed to evaluate the vascular anatomy.  Contrast: OMNIPAQUE IOHEXOL 350 MG/ML SOLN  Comparison: Chest radiograph performed 06/01/2012  Findings: There is pulmonary embolus extending to all lobes of both lungs, primarily segmental and subsegmental in nature, though pulmonary embolus is seen within the right main pulmonary artery, extending distally.  A relatively large clot burden is noted.  The lungs are essentially clear bilaterally.  There is no evidence of significant focal consolidation, pleural effusion or pneumothorax.  No masses are identified; no abnormal focal contrast enhancement is seen.  The mediastinum is unremarkable in appearance.   No mediastinal lymphadenopathy is seen.  No pericardial effusion is identified. The great vessels are grossly unremarkable in appearance.  No axillary lymphadenopathy is seen.  The visualized portions of the thyroid gland are unremarkable in appearance.  The visualized portions of the liver and spleen are unremarkable. A tiny hiatal hernia is suspected.  The visualized portions of the pancreas, adrenal glands and kidneys are grossly unremarkable.  No acute osseous abnormalities are seen.  IMPRESSION:  1.  Pulmonary embolus extending to all lobes of both lungs, primarily segmental and subsegmental in nature, though also seen within the right main pulmonary artery, extending distally. Relatively large clot burden noted. 2.  Lungs clear bilaterally. 3.  Suspect tiny hiatal hernia.  Critical Value/emergent results were called by telephone at the time of interpretation on 06/02/2012 at 12:23 a.m. to Dr. Brandt Loosen, who verbally acknowledged these results.   Original Report Authenticated By: Tonia Ghent, M.D.     Assessment/Plan 37 yo female with unprovoked significant bilateral pulmonary emboli  Principal Problem:   Pulmonary embolism, bilateral Active Problems:   SOB (shortness of breath)  hypercoag panel has been done prior to lovenox administered.  Full dose lovenox and coumadin load.  vss all stable along with normal oxygen sats.  Will monitor close in stepdown unit due to significant clot burder.  Will need hematology f/u.  Will need anticoagulation prob for a year.  Full code.    Shyenne Maggard A 06/02/2012, 1:34 AM'

## 2012-06-02 NOTE — Progress Notes (Signed)
*  Preliminary Results* Bilateral lower extremity venous duplex completed. Bilateral lower extremities are negative for deep vein thrombosis. No evidence of Baker's cyst bilaterally.  06/02/2012 9:44 AM Gertie Fey, RDMS, RDCS

## 2012-06-03 LAB — LUPUS ANTICOAGULANT PANEL

## 2012-06-03 LAB — PROTEIN C, TOTAL: Protein C, Total: 72 % (ref 72–160)

## 2012-06-03 LAB — PROTIME-INR: INR: 1.21 (ref 0.00–1.49)

## 2012-06-03 LAB — BETA-2-GLYCOPROTEIN I ABS, IGG/M/A
Beta-2 Glyco I IgG: 0 G Units (ref <20)
Beta-2-Glycoprotein I IgM: 3 M Units (ref <20)

## 2012-06-03 LAB — PROTEIN C ACTIVITY: Protein C Activity: 85 % (ref 75–133)

## 2012-06-03 MED ORDER — ENOXAPARIN SODIUM 150 MG/ML ~~LOC~~ SOLN
1.5000 mg/kg | SUBCUTANEOUS | Status: DC
  Filled 2012-06-03: qty 1

## 2012-06-03 MED ORDER — ENOXAPARIN SODIUM 150 MG/ML ~~LOC~~ SOLN: 1.5000 mg/kg | SUBCUTANEOUS | Status: DC

## 2012-06-03 MED ORDER — ENOXAPARIN SODIUM 150 MG/ML ~~LOC~~ SOLN
1.5000 mg/kg | SUBCUTANEOUS | Status: DC
Start: 2012-06-04 — End: 2012-06-03

## 2012-06-03 MED ORDER — WARFARIN SODIUM 5 MG PO TABS: 7.5000 mg | ORAL_TABLET | Freq: Once | ORAL | Status: DC

## 2012-06-03 MED ORDER — WARFARIN SODIUM 5 MG PO TABS
5.0000 mg | ORAL_TABLET | Freq: Once | ORAL | Status: DC
  Filled 2012-06-03: qty 1

## 2012-06-03 MED ORDER — ENOXAPARIN SODIUM 150 MG/ML ~~LOC~~ SOLN: 150.0000 mg | SUBCUTANEOUS | Status: DC

## 2012-06-03 NOTE — Discharge Summary (Addendum)
Physician Discharge Summary  Theresa Riley ZOX:[id number] DOB: 1975-08-17 DOA: 06/01/2012  PCP: Pcp Not In System  Admit date: 06/01/2012 Discharge date: 06/03/2012  Time spent: 35 minutes  Recommendations for Outpatient Follow-up:  1. Follow up with Hematologist as an outpatient. 2. Follow up with the coumadin clinic on 4.24.2014. 3. A copy of this Discharge Summary was given to the patient to take to her appointments.  Discharge Diagnoses:  Principal Problem:   Pulmonary embolism, bilateral Active Problems:   SOB (shortness of breath)   Discharge Condition: stable  Diet recommendation: heart healthy diet  Filed Weights   06/02/12 0115 06/02/12 0215  Weight: 96.163 kg (212 lb) 95.5 kg (210 lb 8.6 oz)    History of present illness:  37 yo healthy aaf comes in with 5 days of progressive worsening sob, no cp. No fevers. No cough. No le edema or swelling. Last week she did move here from [place], drove 5 1/2 hours. Otherwise, no other long trips. No recent trauma, surgery or illnessess. No h/o vte. No fhx of vte. On no birth control.    Hospital Course:  SOB (shortness of breath)/Pulmonary embolism, bilateral  - Ct angio 4.19.2014: Pulmonary embolus extending to all lobes of both lungs  - Lovenox and coumadin. She will continue as an outpatient. - hypercoagulable panel to be done as an outpatient. - lower ext DVT showed no DVT - patient saturation remain with in normal. - she will go home on Lovenox and coumadin overlap for 5 days then continue coumadin. - she will need a hematologist follow up for hypercoagulable panel.  Procedures: Ct angio chest : Pulmonary embolus extending to all lobes of both lungs, primarily segmental and subsegmental in nature, though also seen within the right main pulmonary artery, extending distally. Relatively large clot burden noted. 2. Lungs clear bilaterally.    Consultations:  none  Discharge Exam: Filed Vitals:   06/02/12 1059  06/02/12 1241 06/02/12 1935 06/03/12 0455  BP: 138/89 136/87 133/82 128/77  Pulse: 71 82 71 76  Temp: 98.6 F (37 C) 98.7 F (37.1 C) 98.5 F (36.9 C) 98.3 F (36.8 C)  TempSrc: Oral Oral Oral Oral  Resp: 18 16 18 20   Height:      Weight:      SpO2: 100% 100% 100% 100%    General: A&O x  Cardiovascular: RRR Respiratory: good air movement CTA B/L  Discharge Instructions  Discharge Orders   Future Orders Complete By Expires     Diet - low sodium heart healthy  As directed     Increase activity slowly  As directed         Medication List    STOP taking these medications       naproxen sodium 220 MG tablet  Commonly known as:  ANAPROX      TAKE these medications       enoxaparin 150 MG/ML injection  Commonly known as:  LOVENOX  Inject 1 mL (150 mg total) into the skin daily.  Start taking on:  06/04/2012     warfarin 5 MG tablet  Commonly known as:  COUMADIN  Take 1.5 tablets (7.5 mg total) by mouth one time only at 6 PM.           Follow-up Information   Follow up with Pcp Not In System.       The results of significant diagnostics from this hospitalization (including imaging, microbiology, ancillary and laboratory) are listed below for reference.  Significant Diagnostic Studies: Dg Chest 2 View  06/01/2012  *RADIOLOGY REPORT*  Clinical Data: Shortness of breath for 4 days.  CHEST - 2 VIEW  Comparison: Chest radiograph performed 02/13/2009  Findings: The lungs are well-aerated and clear.  There is no evidence of focal opacification, pleural effusion or pneumothorax.  The heart is normal in size; the mediastinal contour is within normal limits.  No acute osseous abnormalities are seen.  IMPRESSION: No acute cardiopulmonary process seen.   Original Report Authenticated By: Tonia Ghent, M.D.    Ct Angio Chest Pe W/cm &/or Wo Cm  06/02/2012  *RADIOLOGY REPORT*  Clinical Data: Shortness of breath.  Assess for pulmonary embolus.  CT ANGIOGRAPHY CHEST   Technique:  Multidetector CT imaging of the chest using the standard protocol during bolus administration of intravenous contrast. Multiplanar reconstructed images including MIPs were obtained and reviewed to evaluate the vascular anatomy.  Contrast: OMNIPAQUE IOHEXOL 350 MG/ML SOLN  Comparison: Chest radiograph performed 06/01/2012  Findings: There is pulmonary embolus extending to all lobes of both lungs, primarily segmental and subsegmental in nature, though pulmonary embolus is seen within the right main pulmonary artery, extending distally.  A relatively large clot burden is noted.  The lungs are essentially clear bilaterally.  There is no evidence of significant focal consolidation, pleural effusion or pneumothorax.  No masses are identified; no abnormal focal contrast enhancement is seen.  The mediastinum is unremarkable in appearance.  No mediastinal lymphadenopathy is seen.  No pericardial effusion is identified. The great vessels are grossly unremarkable in appearance.  No axillary lymphadenopathy is seen.  The visualized portions of the thyroid gland are unremarkable in appearance.  The visualized portions of the liver and spleen are unremarkable. A tiny hiatal hernia is suspected.  The visualized portions of the pancreas, adrenal glands and kidneys are grossly unremarkable.  No acute osseous abnormalities are seen.  IMPRESSION:  1.  Pulmonary embolus extending to all lobes of both lungs, primarily segmental and subsegmental in nature, though also seen within the right main pulmonary artery, extending distally. Relatively large clot burden noted. 2.  Lungs clear bilaterally. 3.  Suspect tiny hiatal hernia.  Critical Value/emergent results were called by telephone at the time of interpretation on 06/02/2012 at 12:23 a.m. to Dr. Brandt Loosen, who verbally acknowledged these results.   Original Report Authenticated By: Tonia Ghent, M.D.     Microbiology: Recent Results (from the past 240 hour(s))   MRSA PCR SCREENING     Status: None   Collection Time    06/02/12  3:45 AM      Result Value Range Status   MRSA by PCR NEGATIVE  NEGATIVE Final   Comment:            The GeneXpert MRSA Assay (FDA     approved for NASAL specimens     only), is one component of a     comprehensive MRSA colonization     surveillance program. It is not     intended to diagnose MRSA     infection nor to guide or     monitor treatment for     MRSA infections.     Labs: Basic Metabolic Panel:  Recent Labs Lab 06/01/12 2133 06/02/12 0500  NA 136 138  K 3.5 3.7  CL 103 104  CO2 25 27  GLUCOSE 96 103*  BUN 10 7  CREATININE 0.76 0.70  CALCIUM 9.0 8.8   Liver Function Tests:  Recent Labs Lab 06/02/12 0500  AST 16  ALT 13  ALKPHOS 96  BILITOT 0.2*  PROT 7.1  ALBUMIN 3.3*   No results found for this basename: LIPASE, AMYLASE,  in the last 168 hours No results found for this basename: AMMONIA,  in the last 168 hours CBC:  Recent Labs Lab 06/01/12 2133 06/02/12 0615  WBC 5.9 4.7  HGB 11.2* 10.4*  HCT 33.2* 31.1*  MCV 84.1 83.6  PLT 306 288   Cardiac Enzymes:  Recent Labs Lab 06/02/12 0249 06/02/12 0752 06/02/12 1455  TROPONINI <0.30 <0.30 <0.30   BNP: BNP (last 3 results)  Recent Labs  06/01/12 2133  PROBNP <5.0   CBG: No results found for this basename: GLUCAP,  in the last 168 hours     Signed:  Marinda Elk  [facility] 06/03/2012, 10:48 AM

## 2012-06-03 NOTE — Discharge Instructions (Signed)
Theresa Riley was admitted to the Hospital on 06/01/2012 and Discharged on Discharge Date 06/03/2012 and should be excused from work/school   for 6   days starting 06/01/2012 , may return to work/school without any restrictions.  Call Lambert Keto MD, [facility] [phone] with questions.  Marinda Elk M.D on 06/03/2012,at 11:01 AM  [facility] Office  [phone]   \Warfarin Coagulopathy Warfarin (Coumadin) coagulopathy refers to bleeding that may occur as a complication of the medicine warfarin. Warfarin is an oral blood thinner (anticoagulant). Warfarin is used for medical conditions where thinning of the blood is needed to prevent blood clots.  CAUSES Bleeding is the most common and most serious complication of warfarin. The amount of bleeding is related to the warfarin dose and length of treatment. In addition, bleeding complications can also occur due to:  Intentional or accidental warfarin overdose.  Underlying medical conditions.  Dietary changes.  Medicine, herbal, supplement, or alcohol interactions. SYMPTOMS Severe bleeding while on warfarin may occur from any tissue or organ. Symptoms of the blood being too thin may include:  Bleeding from the nose or gums.  Blood in bowel movements which may appear as bright red, dark, or black tarry stools.  Blood in the urine which may appear as pink, red, or brown urine.  Unusual bruising or bruising easily.  A cut that does not stop bleeding within 10 minutes.  Vomiting blood or continuous nausea for more than 1 day.  Coughing up blood.  Broken blood vessels in your eye (subconjunctival hemorrhage).  Abdominal or back pain with or without flank bruising.  Sudden, severe headache.  Sudden weakness or numbness of the face, arm, or leg, especially on one side of the body.  Sudden confusion.  Trouble speaking (aphasia) or understanding.  Sudden trouble seeing in one or both eyes.  Sudden  trouble walking.  Dizziness.  Loss of balance or coordination.  Vaginal bleeding.  Swelling or pain at an injection site.  Superficial fat tissue death (necrosis) which may cause skin scarring. This is more common in women and may first present as pain in the waist, thighs, and buttocks.  Fever. HOME CARE INSTRUCTIONS  Always contact your caregiver of any concerns or signs of possible warfarin coagulopathy as soon as possible.  Take warfarin exactly as directed by your caregiver. It is recommended that you take your warfarin dose at the same time of the day. It is preferred that you take warfarin in the late afternoon. If you have been told to stop taking warfarin, do not resume taking warfarin until directed to do so by your caregiver. Follow your caregiver's instructions if you accidentally take an extra dose or miss a dose of warfarin. It is very important to take warfarin as directed since bleeding or blood clots could result in chronic or permanent injury, pain, or disability.  Keep all follow-up appointments with your caregiver as directed. It is very important to keep your appointments. Not keeping appointments could result in a chronic or permanent injury, pain, or disability because warfarin is a medicine that requires close monitoring.  While taking warfarin, you will need to have regular blood tests to measure your blood clotting time. These blood tests usually include both the prothrombin time (PT) and International Normalized Ratio (INR) tests. The PT and INR results allow your caregiver to adjust your dose of warfarin. The dose can change for many reasons. It is critically important that you have your PT and INR levels drawn exactly as  directed. PT and INR lab draws are usually done in the morning. Your warfarin dose may stay the same or change depending on what the PT and INR results are. Be sure to follow up with your caregiver regarding your PT and INR test results and what your  warfarin dosage should be.  Many medicines can interfere with warfarin and affect the PT and INR results. You must tell your caregiver about any and all medicines you take, this includes all vitamins and supplements. Ask your caregiver before taking these. Prescription and over-the-counter medicine consistency is critical to warfarin management. It is important that potential interactions are checked before you start a new medicine. Be especially cautious with aspirin and anti-inflammatory medicines. Ask your caregiver before taking these. Medicines such as antibiotics and acid-reducing medicine can interact with warfarin and can cause an increased warfarin effect. Warfarin can also interfere with the effectiveness of medicines you are taking. Do not take or discontinue any prescribed or over-the-counter medicine except on the advice of your caregiver or pharmacist.  Some vitamins, supplements, and herbal products interfere with the effectiveness of warfarin. Vitamin E may increase the anticoagulant effects of warfarin. Vitamin K may can cause warfarin to be less effective. Do not take or discontinue any vitamin, supplement, or herbal product except on the advice of your caregiver or pharmacist.  Some foods, especially foods high in vitamin K can interfere with the effectiveness of warfarin and affect the PT and INR results. A diet too high in vitamin K can cause warfarin to be less effective. A diet too low in foods containing vitamin K may lead to an excessive warfarin effect. Foods high in vitamin K include spinach, kale, broccoli, cabbage, collard and turnip greens, brussels sprouts, peas, cauliflower, seaweed, and parsley as well as beef and pork liver, green tea, and soybean oil. Eat what you normally eat and keep the vitamin K content of your diet consistent. Avoid major changes in your diet, or notify your caregiver before changing your diet. Arrange a visit with a dietitian to answer your  questions.  If you have a loss of appetite or get the stomach flu (viral gastroenteritis), talk to your caregiver as soon as possible. A decrease in your normal vitamin K intake can make you more sensitive to your usual dose of warfarin.  Some medical conditions may increase your risk for bleeding while you are taking warfarin. A fever, diarrhea lasting more than a day, worsening heart failure, or worsening liver function are some medical conditions that could affect warfarin. Contact your caregiver if you have any of these medical conditions.  Be careful not to cut yourself when using sharp objects or while shaving.  Alcohol can change the body's ability to handle warfarin. It is best to avoid alcoholic drinks or consume only very small amounts while taking warfarin. Notify your caregiver if you change your alcohol intake. A sudden increase in alcohol use can increase your risk of bleeding. Chronic alcohol use can cause warfarin to be less effective.  Limit physical activities or sports that could result in a fall or cause injury.  Do not use warfarin if you are pregnant.  Inform all your caregivers and your dentist that you take warfarin.  Inform all caregivers if you are taking warfarin and aspirin or platelet inhibitor medicines such as clopidogrel, ticagrelor, or prasugrel. Use of these medicines in conjunction with warfarin can increase your risk of bleeding or death. Taking these medicines together should only be done under  the direct care of your caregiver. SEEK IMMEDIATE MEDICAL CARE IF:  You cough up blood.  You have dark or black stools or there is bright red blood coming from your rectum.  You vomit blood or have nausea for more than 1 day.  You have blood in the urine or pink colored urine.  You have unusual bruising or have increased bruising.  You have bleeding from the nose or gums that does not stop quickly.  You have a cut that does not stop bleeding within a 2 3  minutes.  You have sudden weakness or numbness of the face, arm, or leg, especially on one side of the body.  You have sudden confusion.  You have trouble speaking (aphasia) or understanding.  You have sudden trouble seeing in one or both eyes.  You have sudden trouble walking.  You have dizziness.  You have a loss of balance or coordination.  You have a sudden, severe headache.  You have a serious fall or head injury, even if you are not bleeding.  You have swelling or pain at an injection site.  You have unexplained tenderness or pain in the abdomen, back, waist, thighs or buttocks.  You have a fever. Any of these symptoms may represent a serious problem that is an emergency. Do not wait to see if the symptoms will go away. Get medical help right away. Call your local emergency services (911 in [place] Do not drive yourself to the hospital. Document Released: 01/08/2006 Document Revised: 08/01/2011 Document Reviewed: 07/11/2011 [facility] Patient Information 2013 [place], [place].

## 2012-06-03 NOTE — Progress Notes (Signed)
ANTICOAGULATION CONSULT NOTE - Follow-up  Pharmacy Consult for Coumadin Indication: pulmonary embolus  No Known Allergies  Patient Measurements: Height: 5\' 5"  (165.1 cm) Weight: 210 lb 8.6 oz (95.5 kg) IBW/kg (Calculated) : 57  Vital Signs: Temp: 98.3 F (36.8 C) (04/21 0455) Temp src: Oral (04/21 0455) BP: 128/77 mmHg (04/21 0455) Pulse Rate: 76 (04/21 0455)  Labs:  Recent Labs  06/01/12 2133 06/02/12 0249 06/02/12 0500 06/02/12 0615 06/02/12 0752 06/02/12 1455 06/03/12 0535  HGB 11.2*  --   --  10.4*  --   --   --   HCT 33.2*  --   --  31.1*  --   --   --   PLT 306  --   --  288  --   --   --   APTT  --   --   --  41*  --   --   --   LABPROT  --   --   --  14.1  --   --  15.1  INR  --   --   --  1.10  --   --  1.21  CREATININE 0.76  --  0.70  --   --   --   --   TROPONINI  --  <0.30  --   --  <0.30 <0.30  --     Estimated Creatinine Clearance: 111.1 ml/min (by C-G formula based on Cr of 0.7).  Assessment: 37 yof started on lovenox + coumadin for a new unprovoked bilateral PE. INR 1.21 today. She received a total of 20mg  yesterday (early AM dose + PM dose) and continues on lovenox 95mg  SQ Q12H. No bleeding noted but H/H was low at 10.4/31.1 as of 4/20. Homocysteine + antithrombin 3 are WNL, other coag labs are still in process.   Goal of Therapy:  INR 2-3 Monitor platelets by anticoagulation protocol: Yes   Plan:  1. Coumadin 5mg  PO x 1 tonight 2. Continue lovenox as ordered 3. F/u AM INR and CBC Q72H 4. F/u remaining coag panels  Lysle Pearl, PharmD, BCPS Pager # [phone] 06/03/2012 8:46 AM

## 2013-12-15 ENCOUNTER — Encounter (HOSPITAL_COMMUNITY): Payer: Self-pay | Admitting: *Deleted

## 2013-12-19 ENCOUNTER — Encounter (HOSPITAL_COMMUNITY): Payer: Self-pay | Admitting: *Deleted

## 2013-12-19 ENCOUNTER — Inpatient Hospital Stay (HOSPITAL_COMMUNITY)
Admission: AD | Admit: 2013-12-19 | Discharge: 2013-12-19 | Disposition: A | Discharge status: Home / Self Care | Payer: PRIVATE HEALTH INSURANCE | Source: Ambulatory Visit | Attending: Obstetrics & Gynecology | Admitting: Obstetrics & Gynecology

## 2013-12-19 DIAGNOSIS — I10 Essential (primary) hypertension: Secondary | ICD-10-CM | POA: Diagnosis not present

## 2013-12-19 DIAGNOSIS — N939 Abnormal uterine and vaginal bleeding, unspecified: Secondary | ICD-10-CM

## 2013-12-19 DIAGNOSIS — IMO0001 Reserved for inherently not codable concepts without codable children: Secondary | ICD-10-CM

## 2013-12-19 DIAGNOSIS — R030 Elevated blood-pressure reading, without diagnosis of hypertension: Secondary | ICD-10-CM

## 2013-12-19 HISTORY — DX: Other pulmonary embolism without acute cor pulmonale: I26.99

## 2013-12-19 LAB — URINALYSIS, ROUTINE W REFLEX MICROSCOPIC
Bilirubin Urine: NEGATIVE
Glucose, UA: NEGATIVE mg/dL
Ketones, ur: NEGATIVE mg/dL
LEUKOCYTES UA: NEGATIVE
Nitrite: NEGATIVE
Protein, ur: NEGATIVE mg/dL
SPECIFIC GRAVITY, URINE: 1.01 (ref 1.005–1.030)
UROBILINOGEN UA: 0.2 mg/dL (ref 0.0–1.0)
pH: 7 (ref 5.0–8.0)

## 2013-12-19 LAB — WET PREP, GENITAL
Clue Cells Wet Prep HPF POC: NONE SEEN
TRICH WET PREP: NONE SEEN
Yeast Wet Prep HPF POC: NONE SEEN

## 2013-12-19 LAB — URINE MICROSCOPIC-ADD ON

## 2013-12-19 LAB — HIV ANTIBODY (ROUTINE TESTING W REFLEX): HIV 1&2 Ab, 4th Generation: NONREACTIVE

## 2013-12-19 LAB — POCT PREGNANCY, URINE: Preg Test, Ur: NEGATIVE

## 2013-12-19 NOTE — MAU Provider Note (Signed)
CC: Abdominal Pain and Vaginal Discharge    First Provider Initiated Contact with Patient 12/19/13 1223      HPI Theresa Riley is a 38 y.o. G2P0020 who presents with onset of Abnormal vaginal discharged over the last few days. Color was initially white and thick and now has become brownish denies irritation or itch. Denies dysuria, hematuria, frequency or urgency of urination. LMP 11/30/13, cycles regular. Denies abnormal bleeding. No new sex partner. Last intercourse 3 weeks ago. No history postcoital spotting. Condoms for contraception. GYN care [place] at [facility]. Paps normal and up-to-date. PE in 2014, now off all anticoagulants. Denies history hypertension.  Past Medical History  Diagnosis Date  . Yeast infection   . Pulmonary emboli     OB History  Gravida Para Term Preterm AB SAB TAB Ectopic Multiple Living  2    2         # Outcome Date GA Lbr Len/2nd Weight Sex Delivery Anes PTL Lv  2 AB           1 AB             GYN HX: Was told last year she had something on her cx that could be removed, unsure if polyp  Past Surgical History  Procedure Laterality Date  . Wisdom tooth extraction      History   Social History  . Marital Status: Single    Spouse Name: N/A    Number of Children: N/A  . Years of Education: N/A   Occupational History  . Not on file.   Social History Main Topics  . Smoking status: Never Smoker   . Smokeless tobacco: Never Used  . Alcohol Use: No  . Drug Use: No  . Sexual Activity: Yes    Birth Control/ Protection: Condom   Other Topics Concern  . Not on file   Social History Narrative    No current facility-administered medications on file prior to encounter.   Current Outpatient Prescriptions on File Prior to Encounter  Medication Sig Dispense Refill  . enoxaparin (LOVENOX) 150 MG/ML injection Inject 1 mL (150 mg total) into the skin daily. 5 Syringe 0  . warfarin (COUMADIN) 5 MG tablet Take 1.5 tablets (7.5 mg total) by  mouth one time only at 6 PM. 30 tablet 0    No Known Allergies  ROS Pertinent items in HPI  PHYSICAL EXAM Filed Vitals:   12/19/13 1354  BP: 145/97  Pulse: 61  Temp: 98.6 F (37 C)  Resp: 18   General: Well nourished, well developed female in no acute distress Cardiovascular: Normal rate Respiratory: Normal effort Abdomen: Soft, nontender Back: No CVAT Extremities: No edema Neurologic: Alert and oriented Speculum exam: NEFG; vagina with small amount dark brown blood blood; cervix clean, not friable, no active bleeding, no polyp or lesion seen Bimanual exam: cervix closed and no palpable lesion, no CMT; uterus NSSP; no adnexal tenderness or masses   LAB RESULTS Results for orders placed or performed during the hospital encounter of 12/19/13 (from the past 24 hour(s))  Urinalysis, Routine w reflex microscopic     Status: Abnormal   Collection Time: 12/19/13 11:50 AM  Result Value Ref Range   Color, Urine YELLOW YELLOW   APPearance CLEAR CLEAR   Specific Gravity, Urine 1.010 1.005 - 1.030   pH 7.0 5.0 - 8.0   Glucose, UA NEGATIVE NEGATIVE mg/dL   Hgb urine dipstick MODERATE (A) NEGATIVE   Bilirubin Urine NEGATIVE NEGATIVE  Ketones, ur NEGATIVE NEGATIVE mg/dL   Protein, ur NEGATIVE NEGATIVE mg/dL   Urobilinogen, UA 0.2 0.0 - 1.0 mg/dL   Nitrite NEGATIVE NEGATIVE   Leukocytes, UA NEGATIVE NEGATIVE  Urine microscopic-add on     Status: Abnormal   Collection Time: 12/19/13 11:50 AM  Result Value Ref Range   Squamous Epithelial / LPF FEW (A) RARE   RBC / HPF 3-6 <3 RBC/hpf   Bacteria, UA FEW (A) RARE  Pregnancy, urine POC     Status: None   Collection Time: 12/19/13 11:56 AM  Result Value Ref Range   Preg Test, Ur NEGATIVE NEGATIVE  Wet prep, genital     Status: Abnormal   Collection Time: 12/19/13 12:50 PM  Result Value Ref Range   Yeast Wet Prep HPF POC NONE SEEN NONE SEEN   Trich, Wet Prep NONE SEEN NONE SEEN   Clue Cells Wet Prep HPF POC NONE SEEN NONE SEEN    WBC, Wet Prep HPF POC FEW (A) NONE SEEN    IMAGING No results found.  MAU COURSE GC/CT, HIV sent  ASSESSMENT  1. Abnormal vaginal bleeding   2. Elevated BP   History possible cervical polyp  PLAN Discharge home. See AVS for patient education.    Medication List    STOP taking these medications        enoxaparin 150 MG/ML injection  Commonly known as:  LOVENOX     warfarin 5 MG tablet  Commonly known as:  COUMADIN       Follow-up Information    Follow up with Pcp Not In System On 12/24/2013.   Why:  [place]        Donnalyn Juran C Kirsten Spearing, [place] 12/19/2013 12:34 PM

## 2013-12-19 NOTE — MAU Note (Signed)
Started having pain/cramping on Monday, noted a white milky d/c.  Tried to get seen, no time available soon.  Pain has gotten worse and the d/c is changing.

## 2013-12-19 NOTE — Discharge Instructions (Signed)
Abnormal Uterine Bleeding Abnormal uterine bleeding can affect women at various stages in life, including teenagers, women in their reproductive years, pregnant women, and women who have reached menopause. Several kinds of uterine bleeding are considered abnormal, including:  Bleeding or spotting between periods.   Bleeding after sexual intercourse.   Bleeding that is heavier or more than normal.   Periods that last longer than usual.  Bleeding after menopause.  Many cases of abnormal uterine bleeding are minor and simple to treat, while others are more serious. Any type of abnormal bleeding should be evaluated by your health care provider. Treatment will depend on the cause of the bleeding. HOME CARE INSTRUCTIONS Monitor your condition for any changes. The following actions may help to alleviate any discomfort you are experiencing:  Avoid the use of tampons and douches as directed by your health care provider.  Change your pads frequently. You should get regular pelvic exams and Pap tests. Keep all follow-up appointments for diagnostic tests as directed by your health care provider.  SEEK MEDICAL CARE IF:   Your bleeding lasts more than 1 week.   You feel dizzy at times.  SEEK IMMEDIATE MEDICAL CARE IF:   You pass out.   You are changing pads every 15 to 30 minutes.   You have abdominal pain.  You have a fever.   You become sweaty or weak.   You are passing large blood clots from the vagina.   You start to feel nauseous and vomit. MAKE SURE YOU:   Understand these instructions.  Will watch your condition.  Will get help right away if you are not doing well or get worse. Document Released: 01/30/2005 Document Revised: 02/04/2013 Document Reviewed: 08/29/2012 [employer] Patient Information 2015 [employer]. This information is not intended to replace advice given to you by your health care provider. Make sure you discuss any questions you have with your  health care provider.  

## 2013-12-19 NOTE — MAU Note (Signed)
Pt first started noticing discharge on Monday, cramping also started that day.  Discharge was white, now brownish red.

## 2013-12-20 LAB — GC/CHLAMYDIA PROBE AMP
CT Probe RNA: NEGATIVE
GC PROBE AMP APTIMA: NEGATIVE

## 2014-06-04 DIAGNOSIS — R768 Other specified abnormal immunological findings in serum: Secondary | ICD-10-CM | POA: Insufficient documentation

## 2014-07-08 DIAGNOSIS — I87009 Postthrombotic syndrome without complications of unspecified extremity: Secondary | ICD-10-CM

## 2014-07-08 DIAGNOSIS — I872 Venous insufficiency (chronic) (peripheral): Secondary | ICD-10-CM | POA: Insufficient documentation

## 2014-07-08 HISTORY — DX: Postthrombotic syndrome without complications of unspecified extremity: I87.009

## 2016-04-25 LAB — HM COLONOSCOPY

## 2017-12-12 LAB — HM MAMMOGRAPHY

## 2018-07-22 LAB — HM PAP SMEAR

## 2018-09-04 DIAGNOSIS — Z6835 Body mass index (BMI) 35.0-35.9, adult: Secondary | ICD-10-CM | POA: Insufficient documentation

## 2018-09-04 DIAGNOSIS — K1120 Sialoadenitis, unspecified: Secondary | ICD-10-CM | POA: Insufficient documentation

## 2018-09-04 DIAGNOSIS — E669 Obesity, unspecified: Secondary | ICD-10-CM | POA: Insufficient documentation

## 2018-09-04 DIAGNOSIS — Z09 Encounter for follow-up examination after completed treatment for conditions other than malignant neoplasm: Secondary | ICD-10-CM | POA: Insufficient documentation

## 2018-09-04 DIAGNOSIS — E663 Overweight: Secondary | ICD-10-CM | POA: Insufficient documentation

## 2018-09-23 ENCOUNTER — Emergency Department (HOSPITAL_COMMUNITY): Payer: Managed Care, Other (non HMO)

## 2018-09-23 ENCOUNTER — Other Ambulatory Visit: Payer: Self-pay

## 2018-09-23 ENCOUNTER — Emergency Department (HOSPITAL_COMMUNITY)
Admission: EM | Admit: 2018-09-23 | Discharge: 2018-09-23 | Disposition: A | Discharge status: Home / Self Care | Payer: Managed Care, Other (non HMO) | Attending: Emergency Medicine | Admitting: Emergency Medicine

## 2018-09-23 ENCOUNTER — Encounter (HOSPITAL_COMMUNITY): Payer: Self-pay | Admitting: Emergency Medicine

## 2018-09-23 DIAGNOSIS — G514 Facial myokymia: Secondary | ICD-10-CM | POA: Insufficient documentation

## 2018-09-23 DIAGNOSIS — Z7901 Long term (current) use of anticoagulants: Secondary | ICD-10-CM | POA: Diagnosis not present

## 2018-09-23 DIAGNOSIS — M62838 Other muscle spasm: Secondary | ICD-10-CM | POA: Diagnosis present

## 2018-09-23 DIAGNOSIS — Z86711 Personal history of pulmonary embolism: Secondary | ICD-10-CM | POA: Insufficient documentation

## 2018-09-23 DIAGNOSIS — I10 Essential (primary) hypertension: Secondary | ICD-10-CM

## 2018-09-23 LAB — COMPREHENSIVE METABOLIC PANEL
ALT: 21 U/L (ref 0–44)
AST: 23 U/L (ref 15–41)
Albumin: 4.1 g/dL (ref 3.5–5.0)
Alkaline Phosphatase: 91 U/L (ref 38–126)
Anion gap: 8 (ref 5–15)
BUN: 10 mg/dL (ref 6–20)
CO2: 28 mmol/L (ref 22–32)
Calcium: 9.1 mg/dL (ref 8.9–10.3)
Chloride: 104 mmol/L (ref 98–111)
Creatinine, Ser: 0.74 mg/dL (ref 0.44–1.00)
GFR calc Af Amer: >60 mL/min (ref >60)
GFR calc non Af Amer: >60 mL/min (ref >60)
Glucose, Bld: 101 mg/dL — ABNORMAL HIGH (ref 70–99)
Potassium: 4.1 mmol/L (ref 3.5–5.1)
Sodium: 140 mmol/L (ref 135–145)
Total Bilirubin: 0.5 mg/dL (ref 0.3–1.2)
Total Protein: 7.7 g/dL (ref 6.5–8.1)

## 2018-09-23 LAB — CBC
HCT: 41.4 % (ref 36.0–46.0)
Hemoglobin: 13.2 g/dL (ref 12.0–15.0)
MCH: 31.4 pg (ref 26.0–34.0)
MCHC: 31.9 g/dL (ref 30.0–36.0)
MCV: 98.6 fL (ref 80.0–100.0)
Platelets: 282 10*3/uL (ref 150–400)
RBC: 4.2 MIL/uL (ref 3.87–5.11)
RDW: 13.6 % (ref 11.5–15.5)
WBC: 4 10*3/uL (ref 4.0–10.5)
nRBC: 0 % (ref 0.0–0.2)

## 2018-09-23 LAB — PROTIME-INR
INR: 1.9 — ABNORMAL HIGH (ref 0.8–1.2)
Prothrombin Time: 21.3 seconds — ABNORMAL HIGH (ref 11.4–15.2)

## 2018-09-23 LAB — MAGNESIUM: Magnesium: 2.2 mg/dL (ref 1.7–2.4)

## 2018-09-23 NOTE — Discharge Instructions (Addendum)
Please follow-up with your primary care physician if symptoms continue.  Also recommend close follow-up with your primary care doctor as your blood pressure has been elevated in the emergency department today.  Your labs today were normal as well as your head CT.  You have no signs of electrolyte abnormality today or stroke.  Your symptoms are not consistent with Bell's palsy.  I recommend that you increase your water intake, increase the amount of sleep you are getting, decrease amount of caffeine you ingest daily.

## 2018-09-23 NOTE — ED Notes (Signed)
Patient transported to CT 

## 2018-09-23 NOTE — ED Provider Notes (Signed)
TIME SEEN: 11:30 AM  CHIEF COMPLAINT: Facial twitching, facial spasms  HPI: Patient is a 43 year old female with history of pulmonary emboli on Coumadin who presents to the emergency department with 4 days of intermittent twitching underneath her right eye and what she describes as spasming of the right side of her face.  She states that her face will "tighten up" and that this only lasts for less than 60 seconds.  It is happened 3-4 times over the past 4 days.  She states about a month ago she had some pain in her right ear and underneath her jaw and was placed on antibiotics.  States she saw her doctor over a telemetry visit.  She had a CT scan of her neck at that time that did not show acute abnormality.  She states this was done in [place].  She denies any head injury, headache.  No numbness or tingling but does state that her right arm "felt funny" this morning for about 10 to 15 minutes but that resolved.  No chest pain or shortness of breath.  Has never had a stroke or TIA.  No history of similar symptoms.  No twitching or muscle spasms anywhere else.  No fevers, cough, vomiting or diarrhea.  ROS: See HPI Constitutional: no fever  Eyes: no drainage  ENT: no runny nose   Cardiovascular:  no chest pain  Resp: no SOB  GI: no vomiting GU: no dysuria Integumentary: no rash  Allergy: no hives  Musculoskeletal: no leg swelling  Neurological: no slurred speech ROS otherwise negative  PAST MEDICAL HISTORY/PAST SURGICAL HISTORY:  Past Medical History:  Diagnosis Date  . Pulmonary emboli (HCC)   . Yeast infection     MEDICATIONS:  Prior to Admission medications   Not on File    ALLERGIES:  No Known Allergies  SOCIAL HISTORY:  Social History   Tobacco Use  . Smoking status: Never Smoker  . Smokeless tobacco: Never Used  Substance Use Topics  . Alcohol use: No    FAMILY HISTORY: Family History  Problem Relation Age of Onset  . Diabetes Mother     EXAM: BP (!) 165/104    Pulse 76   Temp 98.6 F (37 C) (Oral)   Resp 18   SpO2 99%  CONSTITUTIONAL: Alert and oriented and responds appropriately to questions. Well-appearing; well-nourished HEAD: Normocephalic EYES: Conjunctivae clear, pupils appear equal, EOMI ENT: normal nose; moist mucous membranes; No pharyngeal erythema or petechiae, no tonsillar hypertrophy or exudate, no uvular deviation, no unilateral swelling, no trismus or drooling, no muffled voice, normal phonation, no stridor, no dental caries present, no drainable dental abscess noted, no Ludwig's angina, tongue sits flat in the bottom of the mouth, no angioedema, no facial erythema or warmth, no facial swelling; no pain with movement of the neck.  No pain or swelling over her submandibular glands.  TMs are clear bilaterally without erythema, purulence, bulging, perforation, effusion.  No cerumen impaction or sign of foreign body in the external auditory canal. No inflammation, erythema or drainage from the external auditory canal. No signs of mastoiditis. No pain with manipulation of the pinna bilaterally. NECK: Supple, no meningismus, no nuchal rigidity, no LAD  CARD: RRR; S1 and S2 appreciated; no murmurs, no clicks, no rubs, no gallops RESP: Normal chest excursion without splinting or tachypnea; breath sounds clear and equal bilaterally; no wheezes, no rhonchi, no rales, no hypoxia or respiratory distress, speaking full sentences ABD/GI: Normal bowel sounds; non-distended; soft, non-tender, no rebound, no  guarding, no peritoneal signs, no hepatosplenomegaly BACK:  The back appears normal and is non-tender to palpation, there is no CVA tenderness EXT: Normal ROM in all joints; non-tender to palpation; no edema; normal capillary refill; no cyanosis, no calf tenderness or swelling    SKIN: Normal color for age and race; warm; no rash NEURO: Moves all extremities equally, sensation to light touch intact diffusely, cranial nerves II through XII intact, normal  speech, strength 5/5 in all 4 extremities PSYCH: The patient's mood and manner are appropriate. Grooming and personal hygiene are appropriate.  MEDICAL DECISION MAKING: Patient here with facial twitching and then episodes it seemed like facial spasms.  I have reviewed imaging that patient has on her cell phone that does not look like a facial droop.  She has no focal neurologic deficits today and has no risk factors for stroke or TIA.  She states that her doctor sent her here to rule out Bell's palsy.  We had lengthy discussion that this does not look like Bell's palsy as there is no facial droop and symptoms only last for less than 60 seconds and then resolved.  She seems very concerned that there is something intracranial present today.  I have very low suspicion for stroke but given she had this "funny feeling" in her right arm with right sided facial symptoms, will obtain a head CT today.  We will also check her electrolytes as this may be causing facial twitching.  Will monitor in the ED for further episodes.  She has no sign of zoster, Ramsay Hunt on examination.  No facial pain.  ED PROGRESS: Patient's work-up today is unremarkable.  Normal electrolytes.  Normal head CT.  INR slightly subtherapeutic at 1.9.  She states that she has a protocol to follow to bring her INR up.  She has been slightly hypertensive here and will follow-up with her primary care physician for this.  Patient seems slightly frustrated that we do not have a cause of her symptoms today.  Discussed with her that we have ruled out multiple emergent conditions and that outpatient follow-up is not warranted.  I do not feel she needs further emergent work-up and I am not concerned at this time for life-threatening illness.  She verbalized understanding.  I have given her a copy of her results to take to her primary care doctor in [place].  She plans to return this week.  She has not had any return of symptoms since being in the emergency  department.   At this time, I do not feel there is any life-threatening condition present. I have reviewed and discussed all results (EKG, imaging, lab, urine as appropriate) and exam findings with patient/family. I have reviewed nursing notes and appropriate previous records.  I feel the patient is safe to be discharged home without further emergent workup and can continue workup as an outpatient as needed. Discussed usual and customary return precautions. Patient/family verbalize understanding and are comfortable with this plan.  Outpatient follow-up has been provided as needed. All questions have been answered.      EKG Interpretation  Date/Time:  Monday September 23 2018 12:22:23 EDT Ventricular Rate:  58 PR Interval:    QRS Duration: 86 QT Interval:  422 QTC Calculation: 415 R Axis:   37 Text Interpretation:  Sinus rhythm Borderline T abnormalities, diffuse leads No significant change since last tracing in 2014 Confirmed by Samani Deal, Baxter HireKristen [phone] on 09/23/2018 12:58:40 PM         Jennings Stirling,  Delice Bison, DO 09/23/18 1706

## 2018-09-23 NOTE — ED Notes (Signed)
ED Provider at bedside. 

## 2018-09-23 NOTE — ED Notes (Signed)
An After Visit Summary was printed and given to the patient. Discharge instructions given and no further questions at this time.  

## 2018-09-23 NOTE — ED Triage Notes (Signed)
Pt reports since last Friday had intermittent right eye and facial muscle spasms.

## 2018-10-29 DIAGNOSIS — Z7901 Long term (current) use of anticoagulants: Secondary | ICD-10-CM | POA: Insufficient documentation

## 2019-12-15 ENCOUNTER — Ambulatory Visit: Payer: Managed Care, Other (non HMO)

## 2019-12-16 ENCOUNTER — Ambulatory Visit: Payer: Managed Care, Other (non HMO) | Attending: Internal Medicine

## 2019-12-16 DIAGNOSIS — Z23 Encounter for immunization: Secondary | ICD-10-CM

## 2019-12-16 NOTE — Progress Notes (Signed)
° °  Covid-19 Vaccination Clinic  Name:  Theresa Riley    MRN: [id number] DOB: [birth year]  12/16/2019  [name] was observed post Covid-19 immunization for 15 minutes without incident. She was provided with Vaccine Information Sheet and instruction to access the V-Safe system.   [name] was instructed to call 911 with any severe reactions post vaccine:  Difficulty breathing   Swelling of face and throat   A fast heartbeat   A bad rash all over body   Dizziness and weakness   Immunizations Administered    Name Date Dose VIS Date Route   Pfizer COVID-19 Vaccine 12/16/2019  3:20 PM 0.3 mL 12/03/2019 Intramuscular   Manufacturer: [employer], [employer]   Lot: [id number]   NDC: [address]-7

## 2019-12-19 ENCOUNTER — Encounter (HOSPITAL_COMMUNITY): Payer: Self-pay | Admitting: Emergency Medicine

## 2019-12-19 ENCOUNTER — Emergency Department (HOSPITAL_COMMUNITY)
Admission: EM | Admit: 2019-12-19 | Discharge: 2019-12-20 | Disposition: A | Discharge status: Home / Self Care | Payer: Managed Care, Other (non HMO) | Attending: Emergency Medicine | Admitting: Emergency Medicine

## 2019-12-19 ENCOUNTER — Emergency Department (HOSPITAL_COMMUNITY): Payer: Managed Care, Other (non HMO)

## 2019-12-19 ENCOUNTER — Ambulatory Visit: Payer: Self-pay | Admitting: *Deleted

## 2019-12-19 ENCOUNTER — Other Ambulatory Visit: Payer: Self-pay

## 2019-12-19 ENCOUNTER — Ambulatory Visit (HOSPITAL_COMMUNITY)
Admission: EM | Admit: 2019-12-19 | Discharge: 2019-12-19 | Disposition: A | Discharge status: Home / Self Care | Payer: Managed Care, Other (non HMO)

## 2019-12-19 DIAGNOSIS — R0789 Other chest pain: Secondary | ICD-10-CM | POA: Insufficient documentation

## 2019-12-19 DIAGNOSIS — R079 Chest pain, unspecified: Secondary | ICD-10-CM | POA: Diagnosis present

## 2019-12-19 DIAGNOSIS — R791 Abnormal coagulation profile: Secondary | ICD-10-CM

## 2019-12-19 DIAGNOSIS — Z7901 Long term (current) use of anticoagulants: Secondary | ICD-10-CM | POA: Insufficient documentation

## 2019-12-19 DIAGNOSIS — R5383 Other fatigue: Secondary | ICD-10-CM | POA: Diagnosis not present

## 2019-12-19 LAB — CBC
HCT: 41.4 % (ref 36.0–46.0)
Hemoglobin: 13.4 g/dL (ref 12.0–15.0)
MCH: 32.1 pg (ref 26.0–34.0)
MCHC: 32.4 g/dL (ref 30.0–36.0)
MCV: 99.3 fL (ref 80.0–100.0)
Platelets: 274 10*3/uL (ref 150–400)
RBC: 4.17 MIL/uL (ref 3.87–5.11)
RDW: 13.2 % (ref 11.5–15.5)
WBC: 4.6 10*3/uL (ref 4.0–10.5)
nRBC: 0 % (ref 0.0–0.2)

## 2019-12-19 LAB — PROTIME-INR
INR: 1.7 — ABNORMAL HIGH (ref 0.8–1.2)
Prothrombin Time: 19.4 seconds — ABNORMAL HIGH (ref 11.4–15.2)

## 2019-12-19 LAB — BASIC METABOLIC PANEL
Anion gap: 7 (ref 5–15)
BUN: 8 mg/dL (ref 6–20)
CO2: 25 mmol/L (ref 22–32)
Calcium: 9.4 mg/dL (ref 8.9–10.3)
Chloride: 108 mmol/L (ref 98–111)
Creatinine, Ser: 0.91 mg/dL (ref 0.44–1.00)
GFR, Estimated: >60 mL/min (ref >60)
Glucose, Bld: 94 mg/dL (ref 70–99)
Potassium: 3.6 mmol/L (ref 3.5–5.1)
Sodium: 140 mmol/L (ref 135–145)

## 2019-12-19 LAB — I-STAT BETA HCG BLOOD, ED (MC, WL, AP ONLY): I-stat hCG, quantitative: <5 m[IU]/mL (ref <5)

## 2019-12-19 LAB — TROPONIN I (HIGH SENSITIVITY)
Troponin I (High Sensitivity): 4 ng/L (ref <18)
Troponin I (High Sensitivity): 4 ng/L (ref <18)

## 2019-12-19 NOTE — ED Provider Notes (Signed)
[facility] EMERGENCY DEPARTMENT Provider Note   CSN: [id number] Arrival date & time: 12/19/19  1328     History Chief Complaint  Patient presents with  . Chest Pain    Theresa Riley is a 44 y.o. female presenting for evaluation of chest pain.  Patient states 3 days ago she got the Covid vaccine on her left arm.  Since then, she has had fatigue and pain, mostly of her left axilla and chest.  Pain improved yesterday, however returned today.  She reports pain is worse with inspiration.  She has a history of 6 PEs, is on Coumadin.  However recently her INR has been subtherapeutic.  She drives 10 hours back and forth in [place] regularly (where she lives).  She denies recent surgeries.  No history of cancer.  She is not on hormones.  She reports no other medical problems, takes no medications other than Coumadin daily.  She reports postvaccine chills, no documented fever.  She denies shortness of breath or cough.  No leg pain or swelling.  No nausea, vomiting, abdominal pain, urinary symptoms, abnormal bowel movements.  HPI     Past Medical History:  Diagnosis Date  . Pulmonary emboli (HCC)   . Yeast infection     Patient Active Problem List   Diagnosis Date Noted  . SOB (shortness of breath) 06/02/2012  . Pulmonary embolism, bilateral (HCC) 06/02/2012    Past Surgical History:  Procedure Laterality Date  . WISDOM TOOTH EXTRACTION       OB History    Gravida  2   Para      Term      Preterm      AB  2   Living        SAB      TAB      Ectopic      Multiple      Live Births              Family History  Problem Relation Age of Onset  . Diabetes Mother     Social History   Tobacco Use  . Smoking status: Never Smoker  . Smokeless tobacco: Never Used  Substance Use Topics  . Alcohol use: No  . Drug use: No    Home Medications Prior to Admission medications   Medication Sig Start Date End Date Taking? Authorizing Provider    acetaZOLAMIDE (DIAMOX) 250 MG tablet Take 250 mg by mouth daily.   Yes [provider]  Ascorbic Acid (VITAMIN C) 1000 MG tablet Take 1,000 mg by mouth daily.   Yes [provider]  Cholecalciferol (DIALYVITE VITAMIN D 5000 PO) Take 5,000 Units by mouth daily.   Yes [provider]  Cyanocobalamin (VITAMIN B 12 PO) Take by mouth.   Yes [provider]  vitamin B-12 (CYANOCOBALAMIN) 1000 MCG tablet Take 1,000 mcg by mouth daily.   Yes [provider]  warfarin (COUMADIN) 3 MG tablet Take 9 mg by mouth daily.   Yes [provider]    Allergies    Patient has no known allergies.  Review of Systems   Review of Systems  Cardiovascular: Positive for chest pain.  Hematological: Bruises/bleeds easily.  All other systems reviewed and are negative.   Physical Exam Updated Vital Signs BP (!) 133/97 (BP Location: Left Arm)   Pulse 66   Temp 97.9 F (36.6 C) (Oral)   Resp 18   Ht 5\' 5"  (1.651 m)   Wt  99.8 kg   SpO2 98%   BMI 36.61 kg/m   Physical Exam Vitals and nursing note reviewed.  Constitutional:      General: She is not in acute distress.    Appearance: She is well-developed.     Comments: Appears nontoxic  HENT:     Head: Normocephalic and atraumatic.  Eyes:     Extraocular Movements: Extraocular movements intact.     Conjunctiva/sclera: Conjunctivae normal.     Pupils: Pupils are equal, round, and reactive to light.  Cardiovascular:     Rate and Rhythm: Normal rate and regular rhythm.     Pulses: Normal pulses.  Pulmonary:     Effort: Pulmonary effort is normal. No respiratory distress.     Breath sounds: Normal breath sounds. No wheezing.     Comments: Mild ttp of the L side chest wall and axilla Chest:     Chest wall: Tenderness present.  Abdominal:     General: There is no distension.     Palpations: Abdomen is soft. There is no mass.     Tenderness: There is no abdominal tenderness. There is no guarding or  rebound.  Musculoskeletal:        General: Normal range of motion.     Cervical back: Normal range of motion and neck supple.     Right lower leg: No edema.     Left lower leg: No edema.  Skin:    General: Skin is warm and dry.     Capillary Refill: Capillary refill takes less than 2 seconds.  Neurological:     Mental Status: She is alert and oriented to person, place, and time.     ED Results / Procedures / Treatments   Labs (all labs ordered are listed, but only abnormal results are displayed) Labs Reviewed  PROTIME-INR - Abnormal; Notable for the following components:      Result Value   Prothrombin Time 19.4 (*)    INR 1.7 (*)    All other components within normal limits  BASIC METABOLIC PANEL  CBC  D-DIMER, QUANTITATIVE (NOT AT [facility])  I-STAT BETA HCG BLOOD, ED ([facility], [facility], [facility] ONLY)  TROPONIN I (HIGH SENSITIVITY)  TROPONIN I (HIGH SENSITIVITY)    EKG EKG Interpretation  Date/Time:  Friday December 19 2019 13:28:35 EDT Ventricular Rate:  70 PR Interval:  154 QRS Duration: 74 QT Interval:  376 QTC Calculation: 406 R Axis:   67 Text Interpretation: Normal sinus rhythm Right atrial enlargement ST and T waves changes similar to prior Abnormal ECG No STEMI Confirmed by Alona Bene [id number]) on 12/19/2019 1:38:49 PM   Radiology DG Chest 2 View  Result Date: 12/19/2019 CLINICAL DATA:  Chest pain after COVID vaccine. EXAM: CHEST - 2 VIEW COMPARISON:  CT 06/02/2012.  Chest x-ray 06/01/2012. FINDINGS: Mediastinum hilar structures normal. Lungs are clear. No pleural effusion or pneumothorax. Heart size normal. No acute bony abnormality. Chest is stable from prior exam. IMPRESSION: No acute cardiopulmonary disease.  Scratch Electronically Signed   By: Maisie Fus  Register   On: 12/19/2019 14:27    Procedures Procedures (including critical care time)  Medications Ordered in ED Medications - No data to display  ED Course  I have reviewed the triage vital signs and the nursing  notes.  Pertinent labs & imaging results that were available during my care of the patient were reviewed by me and considered in my medical decision making (see chart for details).    MDM Rules/Calculators/A&P  Pt presenting for evaluation of chest pain. On exam, pt appears nontoxic. Low suspicion for ACS.  Labs obtained in triage interpreted by me, delta troponin negative.  EKG without STEMI or signs of ischemia.  Electrolytes stable.  Chest x-ray viewed interpreted by me, no pneumonia, torso, effusion, cardiomegaly.  However based on patient's history and subtherapeutic INR, consider PE.  Will obtain dimer.  If negative, likely MSK pain/lymphadenopathy due to recent Covid vaccine.  Pt signed out to Marigene Ehlers, PA-C for f/u on ddimer.   Final Clinical Impression(s) / ED Diagnoses Final diagnoses:  None    Rx / DC Orders ED Discharge Orders    None       Alveria Apley, PA-C 12/19/19 2349    Tegeler, Canary Brim, MD 12/19/19 2358

## 2019-12-19 NOTE — Telephone Encounter (Signed)
Patient states she had 1st COVID vaccine Tuesday- she has had SE- normal and some not normal since. Patient does not have a PCP in the area- advised UC for evaluation  Reason for Disposition  Sounds like a severe, unusual reaction to the triager  Answer Assessment - Initial Assessment Questions 1. MAIN CONCERN OR SYMPTOM:  "What is your main concern right now?" "What question do you have?" "What's the main symptom you're worried about?" (e.g., fever, pain, redness, swelling)     12/16/19- L chest pain, fatigue, flu like symptoms, swelling lymph node-L arm, dizzy and nausea 2. VACCINE: "What vaccination did you receive?" "Is this your first or second shot?" (e.g., none; AstraZeneca, J&J, Moderna, [employer], other)     Pfizer- 1 st dose 3. SYMPTOM ONSET: "When did the fatigue, lymph node swelling- next day begin?" (e.g., not relevant; hours, days)      Wednesday 4. SYMPTOM SEVERITY: "How bad is it?"      Patient is still not feeling well 5. FEVER: "Is there a fever?" If Yes, ask: "What is it, how was it measured, and when did it start?"      Low grade- not check 6. PAST REACTIONS: "Have you reacted to immunizations before?" If Yes, ask: "What happened?"     no 7. OTHER SYMPTOMS: "Do you have any other symptoms?"     no  Protocols used: CORONAVIRUS (COVID-19) VACCINE QUESTIONS AND REACTIONS-A-AH

## 2019-12-19 NOTE — Discharge Instructions (Addendum)
Take Tylenol as needed for pain.  Apply an ice pack for 15 to 20 minutes as needed for pain and swelling.  Follow-up with your INR clinic as needed for adjustment of your Coumadin.  You can go ahead increase your home dosage to 12 mg 2 days this week as you have done in the past for subtherapeutic doses.  Follow-up with your primary care doctor as needed for further evaluation, especially before your second COVID-19 dose of the vaccine. It is still safe for you to get the second vaccine and I recommend you do so at the appropriate time.  Return to the emergency room with any new, worsening, concerning symptoms.

## 2019-12-19 NOTE — ED Triage Notes (Signed)
Pt states she got her first covid vaccine on Tuesday, states that on wed she woke up with severe chest pain, thurs chest pain was better but she felt flu like symptoms and swollen lymph nodes under her L arm. States that today she began having chest pain again while at work, hx of PE, currently on coumadin. Endorses some mild sob as well. A/ox, resp e/u, nad.

## 2019-12-19 NOTE — ED Provider Notes (Signed)
44 year old female received a signout from PA Caccavale pending D-dimer.  Per her HPI:  "Theresa Riley is a 44 y.o. female presenting for evaluation of chest pain.  Patient states 3 days ago she got the Covid vaccine on her left arm.  Since then, she has had fatigue and pain, mostly of her left axilla and chest.  Pain improved yesterday, however returned today.  She reports pain is worse with inspiration.  She has a history of 6 PEs, is on Coumadin.  However recently her INR has been subtherapeutic.  She drives 10 hours back and forth in [place] regularly (where she lives).  She denies recent surgeries.  No history of cancer.  She is not on hormones.  She reports no other medical problems, takes no medications other than Coumadin daily.  She reports postvaccine chills, no documented fever.  She denies shortness of breath or cough.  No leg pain or swelling.  No nausea, vomiting, abdominal pain, urinary symptoms, abnormal bowel movements."  On my evaluation, she reports that she will increase her home Coumadin to 12 mg for 2 days when she is subtherapeutic.  She does reports that she frequently eats salad, but typically tries to avoid foods that are on her list that she has been given by her doctor that will cause her INR level to decrease, including leafy green vegetables.  Physical Exam  BP (!) 133/97 (BP Location: Left Arm)   Pulse 66   Temp 97.9 F (36.6 C) (Oral)   Resp 18   Ht 5\' 5"  (1.651 m)   Wt 99.8 kg   SpO2 98%   BMI 36.61 kg/m   Physical Exam Vitals and nursing note reviewed.  Constitutional:      Appearance: She is well-developed.     Comments: No acute distress.  Speaks in complete, fluent sentences without increased work of breathing.  HENT:     Head: Normocephalic and atraumatic.  Cardiovascular:     Rate and Rhythm: Normal rate.  Pulmonary:     Effort: Pulmonary effort is normal.  Abdominal:     General: There is no distension.  Musculoskeletal:        General:  Normal range of motion.     Cervical back: Neck supple.  Neurological:     General: No focal deficit present.     Mental Status: She is alert.     ED Course/Procedures     Procedures  MDM   44 year old female received at signout from PA Caccavale pending D-dimer.  Please see her note for further work-up and medical decision making.  In brief, this is a 44 year old who presents with a subtherapeutic INR.  Patient is on Coumadin and PE was on the differential diagnosis.  The patient did also receive her first COVID-19 vaccine and did have some lymphadenopathy on exam.  D-dimer has been reviewed and independently interpreted by me.  It is not elevated.  No indication for PE study at this time.  Suspect musculoskeletal etiology since she has lymphadenopathy and recently had her first COVID-19 vaccine.  She checks her INR at home.  When she has been subtherapeutic in the past, she will increase her Coumadin to 12 mg 2 times per week until she is therapeutic again.  I have advised her to make this change this week.  I have also advised her to follow-up with her healthcare team prior to receiving her second dose of the COVID-19 vaccine for any other recommendations.  She can take Tylenol and  apply ice packs for musculoskeletal pain.  ER return precautions given.  She is hemodynamically stable and in no acute distress.  Safe for discharge to home with outpatient follow-up as indicated.     Frederik Pear A, PA-C 12/20/19 0040    Gilda Crease, MD 12/20/19 [phone]

## 2019-12-20 LAB — D-DIMER, QUANTITATIVE: D-Dimer, Quant: 0.34 ug/mL-FEU (ref 0.00–0.50)

## 2020-01-06 ENCOUNTER — Ambulatory Visit: Payer: Managed Care, Other (non HMO) | Attending: Internal Medicine

## 2020-01-06 DIAGNOSIS — Z23 Encounter for immunization: Secondary | ICD-10-CM

## 2020-01-06 NOTE — Progress Notes (Signed)
   Covid-19 Vaccination Clinic  Name:  Theresa Riley    MRN: [id number] DOB: 12-Dec-1975  01/06/2020  [name] was observed post Covid-19 immunization for 30 minutes based on pre-vaccination screening without incident. She was provided with Vaccine Information Sheet and instruction to access the V-Safe system.   Ms. Offer was instructed to call 911 with any severe reactions post vaccine: Marland Kitchen Difficulty breathing  . Swelling of face and throat  . A fast heartbeat  . A bad rash all over body  . Dizziness and weakness   Immunizations Administered    Name Date Dose VIS Date Route   Pfizer COVID-19 Vaccine 01/06/2020  3:13 PM 0.3 mL 12/03/2019 Intramuscular   Manufacturer: [employer], [employer]   Lot: [id number]   NDC: [address]-2

## 2020-06-14 ENCOUNTER — Other Ambulatory Visit: Payer: Self-pay

## 2020-06-15 ENCOUNTER — Ambulatory Visit: Payer: Managed Care, Other (non HMO) | Admitting: Nurse Practitioner

## 2020-06-15 ENCOUNTER — Encounter: Payer: Self-pay | Admitting: Nurse Practitioner

## 2020-06-15 VITALS — BP 142/90 | HR 66 | Temp 97.0°F | Ht 65.0 in | Wt 223.8 lb

## 2020-06-15 DIAGNOSIS — Z1231 Encounter for screening mammogram for malignant neoplasm of breast: Secondary | ICD-10-CM

## 2020-06-15 DIAGNOSIS — I83893 Varicose veins of bilateral lower extremities with other complications: Secondary | ICD-10-CM | POA: Diagnosis not present

## 2020-06-15 DIAGNOSIS — Z86711 Personal history of pulmonary embolism: Secondary | ICD-10-CM | POA: Diagnosis not present

## 2020-06-15 NOTE — Patient Instructions (Signed)
Thank you for choosing [facility].  Please Sign medical release to get records from previous pcp, neurology, hematology, GYN, and ophthalmology. Let me know if you need referrals entered to establish with new specialist.  I will let you know if we can intergrate [place] for INR checks.  Use compression stocking during the day and off at night to help with LE edema. Start daily exercise as well.

## 2020-06-15 NOTE — Progress Notes (Signed)
Subjective:  Patient ID: Theresa Riley, female    DOB: August 11, 1975  Age: 45 y.o. MRN: [id number]  CC: Establish Care (New patient/Would like to discuss bilateral leg swelling, left worse than right. )  HPI Transfer from [place]  Previous Health Care team: Dr. Ok Edwards (hematology), Dr. Dorthea Cove (neurology), Dr. Felecia Shelling (PCP), and Dr. Rosalyn Gess (ophthalmology). She was last seen by these providers within the last 58months. reports Hx of PE, OSA, Intermittent R. Hemifacial spasms, and R. Optic nerve edema. She plans to establish care with all above specialist in [place]. She does not need referral at this time.  Hx of PE: First diagnosed in 2014, but had another in 2017. Current use of coumadin. She checks INR at home and reports results to Dr. Eustace Quail via [facility] website. KV#[id number]. No GI/GU/vaginal bleed. Menopausal since 01/2020.  Hx of OSA: Sleep study completed while in [place], never had CPAP titration completed due to insurance issues.  Hx of optic nerve swelling: Resolved with use of diamox. Med discontinued 04/2020. Plans to schedule appt with ophthalmology.  Hx of R. Hemifacial spasm: Triggered by washing her face or brushing her teeth, last for about , spontaneously resolves, last occurred 09/2018. Frequency improved with use of chamomile tea daily in AM.  She also present with chronic LE edema: Onset 2014, no LE pain or claudication or neuropathy, worse with prolong standing, improves with elevation. No tobacco use  BP Readings from Last 3 Encounters:  06/15/20 (!) 142/90  12/20/19 128/89  09/23/18 (!) 155/103   Wt Readings from Last 3 Encounters:  06/15/20 223 lb 12.8 oz (101.5 kg)  12/19/19 220 lb (99.8 kg)  12/19/13 215 lb (97.5 kg)   Reviewed past Medical, Social and Family history today.  Outpatient Medications Prior to Visit  Medication Sig Dispense Refill  . Ascorbic Acid (VITAMIN C) 1000 MG tablet Take 1,000 mg by mouth daily.    .  Cholecalciferol (DIALYVITE VITAMIN D 5000 PO) Take 5,000 Units by mouth daily.    . Cyanocobalamin (VITAMIN B 12 PO) Take by mouth.    . warfarin (COUMADIN) 3 MG tablet Take 9 mg by mouth daily.    Marland Kitchen acetaZOLAMIDE (DIAMOX) 250 MG tablet Take 250 mg by mouth daily. (Patient not taking: Reported on 06/15/2020)    . vitamin B-12 (CYANOCOBALAMIN) 1000 MCG tablet Take 1,000 mcg by mouth daily. (Patient not taking: Reported on 06/15/2020)     No facility-administered medications prior to visit.    ROS See HPI  Objective:  BP (!) 142/90 (BP Location: Left Arm, Patient Position: Sitting, Cuff Size: Large)   Pulse 66   Temp (!) 97 F (36.1 C) (Temporal)   Ht 5\' 5"  (1.651 m)   Wt 223 lb 12.8 oz (101.5 kg)   LMP 02/14/2019   SpO2 98%   BMI 37.24 kg/m   Physical Exam Vitals reviewed.  Constitutional:      Appearance: She is obese.  Cardiovascular:     Rate and Rhythm: Normal rate and regular rhythm.     Pulses: Normal pulses.     Heart sounds: Normal heart sounds.  Pulmonary:     Effort: Pulmonary effort is normal.     Breath sounds: Normal breath sounds.  Musculoskeletal:     Right lower leg: No edema.     Left lower leg: No edema.  Skin:    Findings: No erythema or rash.  Neurological:     Mental Status: She is alert and oriented to person, place,  and time.  Psychiatric:        Mood and Affect: Mood normal.        Behavior: Behavior normal.        Thought Content: Thought content normal.    Assessment & Plan:  This visit occurred during the SARS-CoV-2 public health emergency.  Safety protocols were in place, including screening questions prior to the visit, additional usage of staff PPE, and extensive cleaning of exam room while observing appropriate contact time as indicated for disinfecting solutions.   Theresa Riley was seen today for establish care.  Diagnoses and all orders for this visit:  Varicose veins of both legs with edema  Breast cancer screening by mammogram -      MM DIGITAL SCREENING BILATERAL; Future  History of pulmonary embolism -     Ambulatory referral to Anticoagulation Monitoring  Please Sign medical release to get records from previous pcp, neurology, hematology, GYN, and ophthalmology. Let me know if you need referrals entered to establish with new specialist. Use compression stocking during the day and off at night to help with LE edema. Start daily exercise as well.  Problem List Items Addressed This Visit      Cardiovascular and Mediastinum   Varicose veins of both legs with edema - Primary     Other   History of pulmonary embolism   Relevant Orders   Ambulatory referral to Anticoagulation Monitoring    Other Visit Diagnoses    Breast cancer screening by mammogram       Relevant Orders   MM DIGITAL SCREENING BILATERAL      Follow-up: Return in about 8 months (around 02/15/2021) for CPE (fatsing).  Alysia Penna, NP

## 2020-06-18 ENCOUNTER — Encounter: Payer: Self-pay | Admitting: Cardiovascular Disease

## 2020-06-18 ENCOUNTER — Ambulatory Visit (INDEPENDENT_AMBULATORY_CARE_PROVIDER_SITE_OTHER): Payer: Managed Care, Other (non HMO) | Admitting: Cardiovascular Disease

## 2020-06-18 ENCOUNTER — Other Ambulatory Visit: Payer: Self-pay

## 2020-06-18 VITALS — BP 128/100 | HR 73 | Ht 65.0 in | Wt 226.5 lb

## 2020-06-18 DIAGNOSIS — I2699 Other pulmonary embolism without acute cor pulmonale: Secondary | ICD-10-CM

## 2020-06-18 DIAGNOSIS — R0602 Shortness of breath: Secondary | ICD-10-CM

## 2020-06-18 DIAGNOSIS — M7989 Other specified soft tissue disorders: Secondary | ICD-10-CM

## 2020-06-18 MED ORDER — FUROSEMIDE 20 MG PO TABS: 20.0000 mg | ORAL_TABLET | Freq: Every day | ORAL | 3 refills | Status: DC | PRN

## 2020-06-18 NOTE — Patient Instructions (Addendum)
Medication Instructions:  Lasix/furosemide as needed for leg swelling, shortness of breath  If you need a refill on your cardiac medications before your next appointment, please call your pharmacy.    Lab work: No new labs needed   If you have labs (blood work) drawn today and your tests are completely normal, you will receive your results only by: Marland Kitchen MyChart Message (if you have MyChart) OR . A paper copy in the mail If you have any lab test that is abnormal or we need to change your treatment, we will call you to review the results.   Testing/Procedures: No new testing needed   Follow-Up: At [facility], you and your health needs are our priority.  As part of our continuing mission to provide you with exceptional heart care, we have created designated Provider Care Teams.  These Care Teams include your primary Cardiologist (physician) and Advanced Practice Providers (APPs -  Physician Assistants and Nurse Practitioners) who all work together to provide you with the care you need, when you need it.  . You will need a follow up appointment in 12 months  . Providers on your designated Care Team:   . Nicolasa Ducking, NP . Eula Listen, PA-C . Marisue Ivan, PA-C  Any Other Special Instructions Will Be Listed Below (If Applicable).  COVID-19 Vaccine Information can be found at: [email or web address] For questions related to vaccine distribution or appointments, please email [email or web address] or call [phone].

## 2020-06-18 NOTE — Progress Notes (Signed)
Cardiology Office Note  Date:  06/18/2020   ID:  Theresa Riley, DOB 12/26/1975, MRN [id number]  PCP:  Anne Ng, NP   Chief Complaint  Patient presents with  . New Patient (Initial Visit)    Ref by Alysia Penna, NP to establish care for a history of pulmonary embolism. "doing well."  Medications reviewed by the patient verbally.     HPI:  [name] is a 45 year old woman with past medical history of Pulmonary embolism 2014 and 2017,  on warfarin, often subtherapeutic Obstructive sleep apnea  chronic lower extremity swelling Obesity Seen in the emergency room December 19, 2019 for atypical chest pain Presents by referral from Alysia Penna for [employer] with hematology in [place] Tests from home. Last was 3.4  Records requested from [facility] and reviewed History of PE as below First diagnosed 2014,(year of warfarin)  repeat 2017  CT angio chest 2014 Pulmonary embolus extending to all lobes of both lungs,  primarily segmental and subsegmental in nature, though also seen  within the right main pulmonary artery, extending distally.  Relatively large clot burden noted.  CT chest 2017, acutely SOB Bilateral pulmonary emboli with findings suggestive of right heart strain as described.  -Lower extremity venous Doppler at that time no  DVT  No echocardiogram on record  Appears that she had a cardiac scan in August 2020 at [facility], results unavailable  Echocardiogram performed January 2017 [facility] Normal LV size and function, normal RV size function, atria normal size PA pressure 35-40  Dr. Ok Edwards (hematology), Dr. Dorthea Cove (neurology), Dr. Felecia Shelling (PCP),   Most INR checks in the [facility] system have been subtherapeutic  EKG personally reviewed by myself on todays visit Shows normal sinus rhythm rate 73 bpm no significant ST-T wave changes  PMH:   has a past medical history of Pulmonary emboli (HCC) and Yeast  infection.  PSH:    Past Surgical History:  Procedure Laterality Date  . WISDOM TOOTH EXTRACTION      Current Outpatient Medications  Medication Sig Dispense Refill  . Ascorbic Acid (VITAMIN C) 1000 MG tablet Take 1,000 mg by mouth daily.    . Cholecalciferol (DIALYVITE VITAMIN D 5000 PO) Take 5,000 Units by mouth daily.    . furosemide (LASIX) 20 MG tablet Take 1 tablet (20 mg total) by mouth daily as needed. 30 tablet 3  . vitamin B-12 (CYANOCOBALAMIN) 1000 MCG tablet Take 1,000 mcg by mouth daily.    Marland Kitchen warfarin (COUMADIN) 3 MG tablet Take 9 mg by mouth daily.     No current facility-administered medications for this visit.     Allergies:   Patient has no known allergies.   Social History:  The patient  reports that she has never smoked. She has never used smokeless tobacco. She reports that she does not drink alcohol and does not use drugs.   Family History:   family history includes Deep vein thrombosis in her brother; Diabetes in her mother; Pulmonary embolism in her brother.    Review of Systems: Review of Systems  Constitutional: Negative.   HENT: Negative.   Respiratory: Negative.   Cardiovascular: Positive for leg swelling.  Gastrointestinal: Negative.   Musculoskeletal: Negative.   Neurological: Negative.   Psychiatric/Behavioral: Negative.   All other systems reviewed and are negative.   PHYSICAL EXAM: VS:  BP (!) 128/100 (BP Location: Right Arm, Patient Position: Sitting, Cuff Size: Large)   Pulse 73   Ht 5\' 5"  (  1.651 m)   Wt 226 lb 8 oz (102.7 kg)   LMP 02/14/2019   SpO2 98%   BMI 37.69 kg/m  , BMI Body mass index is 37.69 kg/m. GEN: Well nourished, well developed, in no acute distress HEENT: normal Neck: no JVD, carotid bruits, or masses Cardiac: RRR; no murmurs, rubs, or gallops,trace edema  Respiratory:  clear to auscultation bilaterally, normal work of breathing GI: soft, nontender, nondistended, + BS MS: no deformity or atrophy Skin: warm and  dry, no rash Neuro:  Strength and sensation are intact Psych: euthymic mood, full affect   Recent Labs: 12/19/2019: BUN 8; Creatinine, Ser 0.91; Hemoglobin 13.4; Platelets 274; Potassium 3.6; Sodium 140    Lipid Panel No results found for: CHOL, HDL, LDLCALC, TRIG    Wt Readings from Last 3 Encounters:  06/18/20 226 lb 8 oz (102.7 kg)  06/15/20 223 lb 12.8 oz (101.5 kg)  12/19/19 220 lb (99.8 kg)       ASSESSMENT AND PLAN:  Problem List Items Addressed This Visit      Cardiology Problems   Pulmonary embolism, bilateral (HCC) - Primary   Relevant Medications   furosemide (LASIX) 20 MG tablet   Other Relevant Orders   EKG 12-Lead     Other   SOB (shortness of breath)   Relevant Orders   EKG 12-Lead    Other Visit Diagnoses    Leg swelling         Pulmonary embolism 2014, recurrent 2017, maintained on warfarin self managed at home with home checks Most recent INR per her records is greater than 3 No recent symptoms concerning for recurrent PE, denies significant shortness of breath on exertion -May need Lovenox or Eliquis bridging for procedures  Leg swelling Exacerbated since prior presumed DVTs PEs Recommend compression hose Lasix as needed for worsening symptoms    Total encounter time more than 60 minutes  Greater than 50% was spent in counseling and coordination of care with the patient    Signed, Dossie Arbour, M.D., Ph.D. [facility] [place], [place] [phone]

## 2020-06-21 ENCOUNTER — Telehealth: Payer: Self-pay

## 2020-06-21 NOTE — Telephone Encounter (Signed)
Theresa Riley and I do not have access to add a referral. Can you place referral to Vein and Vascular and we can route to their office?   Thank you

## 2020-06-21 NOTE — Addendum Note (Signed)
Addended by: Wyvonne Lenz on: 06/21/2020 01:06 PM   Modules accepted: Orders

## 2020-06-21 NOTE — Telephone Encounter (Signed)
Done. Thank you.

## 2020-06-21 NOTE — Telephone Encounter (Signed)
Ok to enter referral to veins specialist

## 2020-06-22 ENCOUNTER — Encounter: Payer: Self-pay | Admitting: Nurse Practitioner

## 2020-07-08 ENCOUNTER — Other Ambulatory Visit: Payer: Self-pay | Admitting: *Deleted

## 2020-07-08 DIAGNOSIS — I83893 Varicose veins of bilateral lower extremities with other complications: Secondary | ICD-10-CM

## 2020-07-09 ENCOUNTER — Encounter: Payer: Self-pay | Admitting: Neurology

## 2020-07-13 NOTE — Progress Notes (Signed)
Assessment/Plan:   1.  Idiopathic intracranial hypertension (former pseudotumor cerebri)  -Long discussion with patient today.  I am going to try to get a copy of [place] records.  I am not excited about going in and really doing a lumbar puncture if we have a known diagnosis already, especially since patient is on Coumadin.  I think that the risks of it would outweigh the benefits if the work-up has already been completed.  She agrees  -We will try to get a copy of [place] MRI.  I suspect that MRV was already done as well, although if she had central venous thrombosis, she is already on the treatment for it (Coumadin) as she has a history of PE.  -Increase Diamox, to 500 mg twice per day.  -Discussed the value of weight loss in the treatment of IIH  -Discussed importance of compliance.  Discussed that vision loss can be permanent in this disease.  -Do not see that visual field testing was completed (if it was it was not sent to me).  Called Dr. Dellia Nims office during the appt.   VF testing is scheduled for Sept.    2.  Hx of R hemifacial spasm  -none seen today  3.  Hx of multiple PE's  -on lifelong coumadin Subjective:   Theresa Riley was seen today in neurologic consultation at the request of Manning Charity, OD.  The consultation is for the evaluation of papilledema.  Patient is a 45 year old female with a past medical history of PE in 2014 and 2017, maintained on Coumadin, who presents today with IIH.   pt has known hx of ICH, dx last year in [place] and tx with diamox.  Pt states that it was picked up on routine eye examination.  I do not have any of the prior [place] records, either from the neurologist or the ophthalmologist.  Didn't have LP done (some because of the fact that she was on coumadin and she was told she would need to be off of coumadin for 5 days).  She reports she had MRI and ambulatory EEG.  She is not sure if MRV was done.  She saw opth in [facility] in March and was told that  everything looked good so d/c diamox 250 mg bid in March.   Saw Dr. Emily Filbert for first time on Jun 24, 2020.  Bilateral papilledema was noted.  Doesn't sound like VF testing done but she is unsure.  She restarted her diamox then.   Patient referred to [facility], but could not get into there until August, so subsequently referred here.   Only neuroimaging that I have access to there is a CT brain from August, 2020 for what looks like right hemifacial spasm (unclear as patient was seen in the emergency room for it and it only happened a few times).  I personally reviewed that and it was normal.  Pt does state that neurologist in [place] told her she did, in fact, had hemifacial spasm on the right.  She states that once she restarted the diamox in march, the hemifacial spasm got better.  No HA.  She has some blurriness of the R eye (if covers the L eye).  No paresthesias with the diamox.  Pt doesn't take vit A.  Not on birth control.  Over the last 3-4 years, weight has gone up about 10 lbs (over last year).     ALLERGIES:  No Known Allergies  CURRENT MEDICATIONS:  Outpatient Encounter Medications as of 07/15/2020  Medication Sig  . acetaZOLAMIDE (DIAMOX) 250 MG tablet Take 250 mg by mouth 2 (two) times daily.  . Ascorbic Acid (VITAMIN C) 1000 MG tablet Take 1,000 mg by mouth daily.  . Cholecalciferol (DIALYVITE VITAMIN D 5000 PO) Take 5,000 Units by mouth daily.  . furosemide (LASIX) 20 MG tablet Take 1 tablet (20 mg total) by mouth daily as needed. (Patient taking differently: Take 20 mg by mouth daily as needed. Takes as needed)  . vitamin B-12 (CYANOCOBALAMIN) 1000 MCG tablet Take 1,000 mcg by mouth daily.  . vitamin E 1000 UNIT capsule Take 1,000 Units by mouth daily.  Marland Kitchen warfarin (COUMADIN) 3 MG tablet Take 9 mg by mouth daily.   No facility-administered encounter medications on file as of 07/15/2020.    Objective:   PHYSICAL EXAMINATION:    VITALS:   Vitals:   07/15/20 0849  BP: 134/88  Pulse: 71   SpO2: 98%  Weight: 226 lb (102.5 kg)  Height: 5\' 5"  (1.651 m)    GEN:  Normal appears female in no acute distress.  Appears stated age. HEENT:  Normocephalic, atraumatic. The mucous membranes are moist. The superficial temporal arteries are without ropiness or tenderness. Cardiovascular: Regular rate and rhythm. Lungs: Clear to auscultation bilaterally. Neck/Heme: There are no carotid bruits noted bilaterally.  NEUROLOGICAL: Orientation:  The patient is alert and oriented x 3.   Cranial nerves: There is good facial symmetry.  Fundoscopic attempted but disc margins not well visualized.  Extraocular muscles are intact and visual fields are full to confrontational testing. Speech is fluent and clear. Soft palate rises symmetrically and there is no tongue deviation. Hearing is intact to conversational tone. Tone: Tone is good throughout. Sensation: Sensation is intact to light touch and pinprick throughout (facial, trunk, extremities). Vibration is intact at the bilateral big toe. There is no extinction with double simultaneous stimulation. There is no sensory dermatomal level identified. Coordination:  The patient has no difficulty with RAM's or FNF bilaterally. Motor: Strength is 5/5 in the bilateral upper and lower extremities.  Shoulder shrug is equal and symmetric. There is no pronator drift.  There are no fasciculations noted. DTR's: Deep tendon reflexes are 2/4 at the bilateral biceps, triceps, brachioradialis, patella and achilles.  Plantar responses are downgoing bilaterally.     Total time spent on today's visit was 45 minutes, including both face-to-face time and nonface-to-face time.  Time included that spent on review of records (prior notes available to me/labs/imaging if pertinent), discussing treatment and goals, answering patient's questions and coordinating care.   Cc:  Nche, , NP

## 2020-07-15 ENCOUNTER — Encounter: Payer: Self-pay | Admitting: Neurology

## 2020-07-15 ENCOUNTER — Ambulatory Visit: Payer: Managed Care, Other (non HMO) | Admitting: Neurology

## 2020-07-15 ENCOUNTER — Other Ambulatory Visit: Payer: Self-pay

## 2020-07-15 VITALS — BP 134/88 | HR 71 | Ht 65.0 in | Wt 226.0 lb

## 2020-07-15 DIAGNOSIS — G932 Benign intracranial hypertension: Secondary | ICD-10-CM | POA: Diagnosis not present

## 2020-07-15 DIAGNOSIS — G5131 Clonic hemifacial spasm, right: Secondary | ICD-10-CM

## 2020-07-15 MED ORDER — ACETAZOLAMIDE ER 500 MG PO CP12: 500.0000 mg | ORAL_CAPSULE | Freq: Two times a day (BID) | ORAL | 1 refills | Status: DC

## 2020-07-15 NOTE — Patient Instructions (Signed)
Ferri's Clinical Advisor 2018, 1st Edition (1st ed., pp. 43.e5-690.e6). [place], [place]: Elsevier."> Rosalie Gums and [employer] Neurological Surgery (7th ed., pp. [phone].[phone]). [place], [place]: Elsevier, Inc."> https://www.ncbi.nlm.nih.gov/books/NBK507811/">  Idiopathic Intracranial Hypertension  Idiopathic intracranial hypertension (IIH) is a condition that increases pressure around the brain. The fluid that surrounds the brain and spinal cord (cerebrospinal fluid, or CSF) increases and causes the pressure. Idiopathic means that the cause of this condition is not known. IIH affects the brain and spinal cord (neurological disorder). If this condition is not treated, it can cause vision loss or blindness. What are the causes? The cause of this condition is not known. What increases the risk? The following factors may make you more likely to develop this condition:  Being very overweight (obese).  Being a female between the ages of 64 and 87 years old, who has not gone through menopause.  Taking certain medicines, such as birth control or steroids. What are the signs or symptoms? Symptoms of this condition include:  Headaches. This is the most common symptom.  Brief episodes of total blindness.  Double vision, blurred vision, or poor side (peripheral) vision.  Pain in the shoulders or neck.  Nausea and vomiting.  A sound like rushing water or a pulsing sound within the ears (pulsatile tinnitus), or ringing in the ears. How is this diagnosed? This condition may be diagnosed based on:  Your symptoms and medical history.  Imaging tests of the brain, such as: ? CT scan. ? MRI. ? Magnetic resonance venogram (MRV) to check the veins.  Diagnostic lumbar puncture. This is a procedure to remove and examine a sample of cerebrospinal fluid. This procedure can determine whether too much fluid may be causing IIH.  A thorough eye exam to check for swelling or nerve damage in the eyes. How is this  treated? Treatment for this condition depends on the symptoms. The goal of treatment is to decrease the pressure around your brain. Common treatments include:  Weight loss through healthy eating, salt restriction, and exercise, if you are overweight.  Medicines to decrease the production of spinal fluid and lower the pressure within your skull.  Medicines to prevent or treat headaches. Other treatments may include:  Surgery to place drains (shunts) in your brain for removing excess fluid.  Lumbar puncture to remove excess cerebrospinal fluid. Follow these instructions at home:  If you are overweight or obese, work with your health care provider to lose weight.  Take over-the-counter and prescription medicines only as told by your health care provider.  Ask your health care provider if the medicine prescribed to you requires you to avoid driving or using machinery.  Do not use any products that contain nicotine or tobacco, such as cigarettes, e-cigarettes, and chewing tobacco. If you need help quitting, ask your health care provider.  Keep all follow-up visits as told by your health care provider. This is important. Contact a health care provider if: You have changes in your vision, such as:  Double vision.  Blurred vision.  Poor peripheral vision. Get help right away if: You have any of the following symptoms and they get worse or do not get better:  Headaches.  Nausea.  Vomiting.  Sudden trouble seeing. Summary  Idiopathic intracranial hypertension (IIH) is a condition that increases pressure around the brain. The cause is not known (is idiopathic).  The most common symptom of IIH is headaches. Vision changes, pain in the shoulders or neck, nausea, and vomiting may also occur.  Treatment for this condition  depends on your symptoms. The goal of treatment is to decrease the pressure around your brain.  If you are overweight or obese, work with your health care  provider to lose weight.  Take over-the-counter and prescription medicines only as told by your health care provider. This information is not intended to replace advice given to you by your health care provider. Make sure you discuss any questions you have with your health care provider. Document Revised: 01/11/2019 Document Reviewed: 01/11/2019 Elsevier Patient Education  2021 [employer].

## 2020-07-19 ENCOUNTER — Ambulatory Visit: Payer: Managed Care, Other (non HMO) | Admitting: Neurology

## 2020-07-27 ENCOUNTER — Telehealth: Payer: Self-pay | Admitting: Neurology

## 2020-07-27 NOTE — Telephone Encounter (Signed)
Prior neuro records from [facility] neuro only consisted of sleep study.

## 2020-07-28 NOTE — Progress Notes (Deleted)
[facility]           OF [place]  History and Physical   Theresa Riley is a 45 y.o. female who presents with hx of leg swelling.  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  She was recently seen by cardiology and her leg swelling exacerbated since prior presumed DVT's and PE's and he  recommended compression hose and lasix for worsening sx.    The patient has *** history of DVT. Pt does *** history of varicose vein.   Pt does *** history of skin changes in lower legs.   There is *** family history of PE.  The patient has *** used compression stockings in the past.     The pt is not on a statin for cholesterol management.  The pt is not on a daily aspirin.   Other AC:  coumadin The pt is not on medication for hypertension.   The pt is not diabetic.   Tobacco hx:  never   Past Medical History:  Diagnosis Date   Pulmonary emboli (HCC)    Yeast infection     Past Surgical History:  Procedure Laterality Date   WISDOM TOOTH EXTRACTION      Social History   Socioeconomic History   Marital status: Single    Spouse name: Not on file   Number of children: 2   Years of education: Not on file   Highest education level: Not on file  Occupational History   Not on file  Tobacco Use   Smoking status: Never   Smokeless tobacco: Never  Vaping Use   Vaping Use: Never used  Substance and Sexual Activity   Alcohol use: No   Drug use: No   Sexual activity: Yes    Birth control/protection: Abstinence  Other Topics Concern   Not on file  Social History Narrative   Right Handed    Lives in a two story home    Social Determinants of Health   Financial Resource Strain: Not on file  Food Insecurity: Not on file  Transportation Needs: Not on file  Physical Activity: Not on file  Stress: Not on file  Social Connections: Not on file  Intimate Partner Violence: Not on file     Family History  Problem Relation Age of  Onset   Pulmonary embolism Brother    Deep vein thrombosis Brother     Current Outpatient Medications  Medication Sig Dispense Refill   acetaZOLAMIDE (DIAMOX) 500 MG capsule Take 1 capsule (500 mg total) by mouth 2 (two) times daily. 180 capsule 1   Ascorbic Acid (VITAMIN C) 1000 MG tablet Take 1,000 mg by mouth daily.     Cholecalciferol (DIALYVITE VITAMIN D 5000 PO) Take 5,000 Units by mouth daily.     furosemide (LASIX) 20 MG tablet Take 1 tablet (20 mg total) by mouth daily as needed. (Patient taking differently: Take 20 mg by mouth daily as needed. Takes as needed) 30 tablet 3   vitamin B-12 (CYANOCOBALAMIN) 1000 MCG tablet Take 1,000 mcg by mouth daily.     vitamin E 1000 UNIT capsule Take 1,000 Units by mouth daily.     warfarin (COUMADIN) 3 MG tablet Take 9 mg by mouth daily.     No current facility-administered medications for this visit.    No Known Allergies  REVIEW OF SYSTEMS:  [X]  denotes positive finding, [ ]  denotes  negative finding Cardiac  Comments:  Chest pain or chest pressure:    Shortness of breath upon exertion:    Short of breath when lying flat:    Irregular heart rhythm:        Vascular    Pain in calf, thigh, or hip brought on by ambulation:    Pain in feet at night that wakes you up from your sleep:     Blood clot in your veins: x Hx PE  Leg swelling:  x       Pulmonary    Oxygen at home:    Productive cough:     Wheezing:         Neurologic    Sudden weakness in arms or legs:     Sudden numbness in arms or legs:     Sudden onset of difficulty speaking or slurred speech:    Temporary loss of vision in one eye:     Problems with dizziness:         Gastrointestinal    Blood in stool:     Vomited blood:         Genitourinary    Burning when urinating:     Blood in urine:        Psychiatric    Major depression:         Hematologic    Bleeding problems:    Problems with blood clotting too easily:        Skin    Rashes or ulcers:         Constitutional    Fever or chills:      PHYSICAL EXAMINATION:   General:  WDWN in NAD; vital signs documented above Gait: Not observed HENT: WNL, normocephalic Pulmonary: normal non-labored breathing without wheezing Cardiac: {Desc; regular/irreg:14544} HR; {With/Without:20273} carotid bruit*** Abdomen: soft, NT, no masses; aortic pulse is *** palpable Skin: {With/Without:20273} rashes Vascular Exam/Pulses:  Right Left  Radial {Exam; arterial pulse strength 0-4:30167} {Exam; arterial pulse strength 0-4:30167}  DP {Exam; arterial pulse strength 0-4:30167} {Exam; arterial pulse strength 0-4:30167}  PT {Exam; arterial pulse strength 0-4:30167} {Exam; arterial pulse strength 0-4:30167}   Extremities: ***  Neurologic: A&O X 3;  moving all extremities equally Psychiatric:  The pt has {Desc; normal/abnormal:11317} affect.   Non-Invasive Vascular Imaging:   Venous duplex on 07/30/2020:    Theresa Riley is a 45 y.o. female who presents with: leg swelling and hx of PE in 2014 and 2017 and on coumadin.   -pt has *** pedal pulses -pt does ***have evidence of DVT.  Pt does ***have venous reflux *** -discussed with pt about wearing *** high *** mmHg compression stockings  -discussed the importance of leg elevation and how to elevate properly - pt is advised to elevate their legs and a diagram is given to them to demonstrate to lay flat on their back with knees elevated and slightly bent with their feet higher than her knees, which puts their feet higher than their heart for 15 minutes per day.  If they cannot lay flat, advised to lay as flat as possible.  -pt is advised to continue as much walking as possible and avoid sitting or standing for long periods of time.  -discussed importance of weight loss and exercise and that water aerobics would also be beneficial.  -handout with recommendations given -pt will f/u ***   Doreatha Massed, [facility] 07/28/2020 4:00  PM  Clinic MD:  Randie Heinz

## 2020-07-30 ENCOUNTER — Ambulatory Visit (HOSPITAL_COMMUNITY): Payer: Managed Care, Other (non HMO)

## 2020-08-24 ENCOUNTER — Ambulatory Visit
Admission: RE | Admit: 2020-08-24 | Discharge: 2020-08-24 | Disposition: A | Discharge status: Home / Self Care | Payer: Managed Care, Other (non HMO) | Source: Ambulatory Visit | Attending: Nurse Practitioner | Admitting: Nurse Practitioner

## 2020-08-24 ENCOUNTER — Other Ambulatory Visit: Payer: Self-pay

## 2020-08-24 DIAGNOSIS — Z1231 Encounter for screening mammogram for malignant neoplasm of breast: Secondary | ICD-10-CM

## 2020-08-25 NOTE — Progress Notes (Deleted)
[facility]IN [facility]           OF [place]  History and Physical   Theresa Riley is a 45 y.o. female who presents with leg swelling.    She has hx of PE in 2014 and 2017.   Pt's brother has hx of PE/DVT.  She is followed by hematology in [place].  She is on coumadin and has been mostly  subtherapeutic with her INR checks.  She was seen by cardiology in May 2022 and felt her leg swelling was exacerbated by prior presumed DVT's/PE's and recommended compression socks and lasix as needed.  It was felt at that visit, that she may need Lovenox or Eliquis for bridging for procedures given hx of clots.     The patient has *** history of DVT. Pt does *** history of varicose vein.   Pt does *** history of skin changes in lower legs.   There is *** family history of venous disorders.   The patient has *** used compression stockings in the past.    ***  The pt is not on a statin for cholesterol management.  The pt is not on a daily aspirin.   Other AC:  coumadin The pt is not on medication for hypertension.   The pt is not diabetic.   Tobacco hx:  never   Past Medical History:  Diagnosis Date   Pulmonary emboli (HCC)    Yeast infection     Past Surgical History:  Procedure Laterality Date   WISDOM TOOTH EXTRACTION      Social History   Socioeconomic History   Marital status: Single    Spouse name: Not on file   Number of children: 2   Years of education: Not on file   Highest education level: Not on file  Occupational History   Not on file  Tobacco Use   Smoking status: Never   Smokeless tobacco: Never  Vaping Use   Vaping Use: Never used  Substance and Sexual Activity   Alcohol use: No   Drug use: No   Sexual activity: Yes    Birth control/protection: Abstinence  Other Topics Concern   Not on file  Social History Narrative   Right Handed    Lives in a two story home    Social Determinants of Health   Financial Resource Strain: Not on  file  Food Insecurity: Not on file  Transportation Needs: Not on file  Physical Activity: Not on file  Stress: Not on file  Social Connections: Not on file  Intimate Partner Violence: Not on file     Family History  Problem Relation Age of Onset   Pulmonary embolism Brother    Deep vein thrombosis Brother     Current Outpatient Medications  Medication Sig Dispense Refill   acetaZOLAMIDE (DIAMOX) 500 MG capsule Take 1 capsule (500 mg total) by mouth 2 (two) times daily. 180 capsule 1   Ascorbic Acid (VITAMIN C) 1000 MG tablet Take 1,000 mg by mouth daily.     Cholecalciferol (DIALYVITE VITAMIN D 5000 PO) Take 5,000 Units by mouth daily.     furosemide (LASIX) 20 MG tablet Take 1 tablet (20 mg total) by mouth daily as needed. (Patient taking differently: Take 20 mg by mouth daily as needed. Takes as needed) 30 tablet 3   vitamin B-12 (CYANOCOBALAMIN) 1000 MCG tablet Take 1,000 mcg by mouth daily.     vitamin E 1000 UNIT capsule  Take 1,000 Units by mouth daily.     warfarin (COUMADIN) 3 MG tablet Take 9 mg by mouth daily.     No current facility-administered medications for this visit.    No Known Allergies  REVIEW OF SYSTEMS:  *** [X]  denotes positive finding, [ ]  denotes negative finding Cardiac  Comments:  Chest pain or chest pressure:    Shortness of breath upon exertion:    Short of breath when lying flat:    Irregular heart rhythm:        Vascular    Pain in calf, thigh, or hip brought on by ambulation:    Pain in feet at night that wakes you up from your sleep:     Blood clot in your veins:    Leg swelling:         Pulmonary    Oxygen at home:    Productive cough:     Wheezing:         Neurologic    Sudden weakness in arms or legs:     Sudden numbness in arms or legs:     Sudden onset of difficulty speaking or slurred speech:    Temporary loss of vision in one eye:     Problems with dizziness:         Gastrointestinal    Blood in stool:     Vomited  blood:         Genitourinary    Burning when urinating:     Blood in urine:        Psychiatric    Major depression:         Hematologic    Bleeding problems:    Problems with blood clotting too easily:        Skin    Rashes or ulcers:        Constitutional    Fever or chills:      PHYSICAL EXAMINATION:  ***  General:  WDWN in NAD; vital signs documented above Gait: Not observed HENT: WNL, normocephalic Pulmonary: normal non-labored breathing without wheezing Cardiac: {Desc; regular/irreg:14544} HR; {With/Without:20273} carotid bruit*** Abdomen: soft, NT, no masses; aortic pulse is *** palpable Skin: {With/Without:20273} rashes Vascular Exam/Pulses:  Right Left  Radial {Exam; arterial pulse strength 0-4:30167} {Exam; arterial pulse strength 0-4:30167}  DP {Exam; arterial pulse strength 0-4:30167} {Exam; arterial pulse strength 0-4:30167}  PT {Exam; arterial pulse strength 0-4:30167} {Exam; arterial pulse strength 0-4:30167}   Extremities: ***  Neurologic: A&O X 3;  moving all extremities equally Psychiatric:  The pt has {Desc; normal/abnormal:11317} affect.   Non-Invasive Vascular Imaging:   Venous duplex on 08/26/2020: ***   Theresa Riley is a 45 y.o. female who presents with: ***  -pt has *** pedal pulses -pt does ***have evidence of DVT.  Pt does ***have venous reflux *** *** -discussed with pt about wearing *** high *** mmHg compression stockings  -discussed the importance of leg elevation and how to elevate properly - pt is advised to elevate their legs and a diagram is given to them to demonstrate to lay flat on their back with knees elevated and slightly bent with their feet higher than her knees, which puts their feet higher than their heart for 15 minutes per day.  If they cannot lay flat, advised to lay as flat as possible.  -pt is advised to continue as much walking as possible and avoid sitting or standing for long periods of time.  -discussed  importance of weight loss and exercise and  that water aerobics would also be beneficial.  -handout with recommendations given -pt will f/u ***   Doreatha Massed, [facility] 08/25/2020 3:39 PM  Clinic MD:  Darrick Penna

## 2020-08-26 ENCOUNTER — Encounter (HOSPITAL_COMMUNITY): Payer: Managed Care, Other (non HMO)

## 2020-08-30 ENCOUNTER — Telehealth: Payer: Self-pay | Admitting: Nurse Practitioner

## 2020-08-30 ENCOUNTER — Encounter: Payer: Self-pay | Admitting: Nurse Practitioner

## 2020-08-30 DIAGNOSIS — G4733 Obstructive sleep apnea (adult) (pediatric): Secondary | ICD-10-CM | POA: Insufficient documentation

## 2020-08-30 NOTE — Telephone Encounter (Signed)
Need for CPAP machine ?

## 2020-08-30 NOTE — Telephone Encounter (Signed)
Pt states she does need one and has gave all her previous information to her current neurologist but has not heard back from them.

## 2020-09-01 NOTE — Telephone Encounter (Signed)
LVM for patient to return call. 

## 2020-09-17 ENCOUNTER — Ambulatory Visit: Payer: Managed Care, Other (non HMO) | Admitting: Diagnostic Neuroimaging

## 2020-09-21 ENCOUNTER — Encounter: Payer: Self-pay | Admitting: Neurology

## 2020-10-13 ENCOUNTER — Ambulatory Visit: Payer: Managed Care, Other (non HMO) | Admitting: Physician Assistant

## 2020-10-13 ENCOUNTER — Other Ambulatory Visit: Payer: Self-pay

## 2020-10-13 ENCOUNTER — Ambulatory Visit (HOSPITAL_COMMUNITY)
Admission: RE | Admit: 2020-10-13 | Discharge: 2020-10-13 | Disposition: A | Discharge status: Home / Self Care | Payer: Managed Care, Other (non HMO) | Source: Ambulatory Visit | Attending: Vascular Surgery | Admitting: Vascular Surgery

## 2020-10-13 VITALS — BP 135/88 | HR 63 | Temp 97.9°F | Resp 20 | Ht 65.0 in | Wt 228.0 lb

## 2020-10-13 DIAGNOSIS — I83893 Varicose veins of bilateral lower extremities with other complications: Secondary | ICD-10-CM

## 2020-10-13 NOTE — Progress Notes (Signed)
Requested by:  Anne Ng, NP [address] [place],  [place] [address]  Reason for consultation: varicose veins with edema   History of Present Illness   Theresa Riley is a 45 y.o. ([birth year]) female who presents for evaluation of varicose veins with edema. She has history of recurrent pulmonary emboli. Her first was back in 2014 and second in 2017. They never found any DVT's at time of her PE. She now is on lifelong anticoagulation with Warfarin. She had full workup by Hematologist and they never found any clotting factors or reason for her hypercoagulability. She has no family history of DVT or PE. As far as her legs go she does have some noticeable spider veins but they do no bother her at all. She does have swelling in both legs and ankles, left greater than right. This is usually not present upon first waking but increases as the day goes on. She feels sometimes it is depending on her activity level as well as the shoes that she wears. 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 She has since changed positions and now works form home mostly sitting. She does have a standing desk so she alternates from sitting and standing all day. She has recently started wearing knee high compression stockings. She believes they are the 20-30 mmHg compression. She does feel they are helping some. She has not regularly been elevating. Aside from the swelling she does have some tenderness in her legs when she touches them. This seems to be always present. She otherwise denies any aching, heaviness, throbbing, pain, burning  or itching.   Venous symptoms include: swelling, tenderness Onset/duration:  >7 years Occupation:  Public house manager factors: sitting, standing Alleviating factors: compression  Compression:  knee high Helps:  yes Pain medications:  none Previous vein procedures:  none History of DVT: No but history of PE's  Past  Medical History:  Diagnosis Date   Pulmonary emboli (HCC)    Yeast infection     Past Surgical History:  Procedure Laterality Date   WISDOM TOOTH EXTRACTION      Social History   Socioeconomic History   Marital status: Single    Spouse name: Not on file   Number of children: 2   Years of education: Not on file   Highest education level: Not on file  Occupational History   Not on file  Tobacco Use   Smoking status: Never   Smokeless tobacco: Never  Vaping Use   Vaping Use: Never used  Substance and Sexual Activity   Alcohol use: No   Drug use: No   Sexual activity: Yes    Birth control/protection: Abstinence  Other Topics Concern   Not on file  Social History Narrative   Right Handed    Lives in a two story home    Social Determinants of Health   Financial Resource Strain: Not on file  Food Insecurity: Not on file  Transportation Needs: Not on file  Physical Activity: Not on file  Stress: Not on file  Social Connections: Not on file  Intimate Partner Violence: Not on file    Family History  Problem Relation Age of Onset   Pulmonary embolism Brother    Deep vein thrombosis Brother     Current Outpatient Medications  Medication Sig Dispense Refill   acetaZOLAMIDE (DIAMOX) 500 MG capsule Take 1 capsule (500 mg total) by mouth 2 (two)  times daily. 180 capsule 1   Ascorbic Acid (VITAMIN C) 1000 MG tablet Take 1,000 mg by mouth daily.     Cholecalciferol (DIALYVITE VITAMIN D 5000 PO) Take 5,000 Units by mouth daily.     potassium chloride (KLOR-CON) 10 MEQ tablet Take 10 mEq by mouth daily.     vitamin B-12 (CYANOCOBALAMIN) 1000 MCG tablet Take 1,000 mcg by mouth daily.     warfarin (COUMADIN) 3 MG tablet Take 9 mg by mouth daily.     No current facility-administered medications for this visit.    No Known Allergies  REVIEW OF SYSTEMS (negative unless checked):   Cardiac:  []  Chest pain or chest pressure? []  Shortness of breath upon activity? []   Shortness of breath when lying flat? []  Irregular heart rhythm?  Vascular:  []  Pain in calf, thigh, or hip brought on by walking? []  Pain in feet at night that wakes you up from your sleep? []  Blood clot in your veins? [x]  Leg swelling?  Pulmonary:  []  Oxygen at home? []  Productive cough? []  Wheezing?  Neurologic:  []  Sudden weakness in arms or legs? []  Sudden numbness in arms or legs? []  Sudden onset of difficult speaking or slurred speech? []  Temporary loss of vision in one eye? []  Problems with dizziness?  Gastrointestinal:  []  Blood in stool? []  Vomited blood?  Genitourinary:  []  Burning when urinating? []  Blood in urine?  Psychiatric:  []  Major depression  Hematologic:  []  Bleeding problems? []  Problems with blood clotting?  Dermatologic:  []  Rashes or ulcers?  Constitutional:  []  Fever or chills?  Ear/Nose/Throat:  []  Change in hearing? []  Nose bleeds? []  Sore throat?  Musculoskeletal:  []  Back pain? []  Joint pain? []  Muscle pain?   Physical Examination     Vitals:   10/13/20 1144  BP: 135/88  Pulse: 63  Resp: 20  Temp: 97.9 F (36.6 C)  TempSrc: Temporal  SpO2: 98%  Weight: 228 lb (103.4 kg)  Height: 5\' 5"  (1.651 m)   Body mass index is 37.94 kg/m.  General:  WDWN in NAD; vital signs documented above Gait: Normal HENT: WNL, normocephalic Pulmonary: normal non-labored breathing , without  wheezing Cardiac: regular HR Vascular Exam/Pulses: bilateral lower extremities warm and well perfused. 2+ pedal pulses bilaterally Extremities: without varicose veins, with reticular veins, without edema, without stasis pigmentation, without lipodermatosclerosis, without ulcers Musculoskeletal: no muscle wasting or atrophy  Neurologic: A&O X 3;  No focal weakness or paresthesias are detected Psychiatric:  The pt has Normal affect.  Non-invasive Vascular Imaging   BLE Venous Insufficiency Duplex (10/13/20):  LLE: No DVT and SVT No GSV reflux   GSV diameter 0.32-0.36 No SSV reflux  No deep venous reflux   Medical Decision Making   Theresa Riley is a 45 y.o. female who presents with lower extremity swelling and some reticular veins. She has no symptoms associated with her reticular veins. Her duplex today shows no SVT or DVT. She has no deep or superficial reflux. I suspect that she does have some venous insufficiency it is just no appreciable on today's duplex. Based on the patient's history and examination, I recommend continued use of knee high compression stockings, proper elevation, refraining from prolonged sitting or standing, exercise and weight reduction. She will follow up with as needed if she has new or worsening symptoms.   , PA-C [facility] [place] Office: [phone]  10/13/2020, 12:16 PM  Clinic MD: Cain/ 

## 2020-10-28 ENCOUNTER — Ambulatory Visit: Payer: Managed Care, Other (non HMO)

## 2020-11-25 NOTE — Progress Notes (Signed)
Assessment/Plan:   1.  Idiopathic intracranial hypertension (former pseudotumor cerebri)             -Very few records from her prior neurologist in [place] were received.  She did have MRI of the brain in February, 2021 which was essentially unremarkable, with the exception of demonstrating slitlike ventricles and enlarged empty sella.  -LP never done because patient on Coumadin.  -MRV never done, but if she had a history of central venous thrombosis, patient is already on the treatment for it (Coumadin) as she has a history of PE.             -Discussed with the patient that because she cannot have lumbar punctures, we have to follow her through ophthalmologic testing.  She cx the visual field testing with Dr. Emily Filbert (but that was apparently b/c of insurance and she has gone to a different dr and had it done).  Discussed with her that this will be the way that we monitor medication and decide if we can safely get her off of medication.  She has now been following with Dr. Harlon Flor due to insurance change.  We will try to get a copy of records  -needs labs today - hasn't had any since diamox increased but she states that hematology has her on K  -discussed value of weight loss even if modest amt   2.  Hx of R hemifacial spasm             -none seen today but suspect that the asymmetry she notes is from this.  However, pt states that has not had spasm in the [facility] for 2 years (but has noted "twitch" in the R eye occasionally)  -we will do MRI brain  -will check AchR Ab as L eye with ptosis c/t R   3.  Hx of multiple PE's             -on lifelong coumadin  4.  F/u 6- 8 months   Subjective:   Theresa Riley was seen today in follow up for IIH.  My previous records as well as any outside records available were reviewed prior to todays visit.  We tried to get a copy of her records from [facility] neurology in [place].  The records that came to [place] were quite unrevealing -an ambulatory EEG and sleep  study really being the only thing that were sent.  MRI brain was sent with contrast.  No MRV was completed.  Pt is currently on Diamox, 500 mg twice per day.  This was increased at our last visit.  Patient was following up with Dr. Emily Filbert.  I called his office when she was here last visit to try to get a copy of visual field testing.  Unfortunately, that was not received.  When we called his office again late last week, they stated that the patient did not come for visual field testing and it was never done.  Pt states that she had to find a new ophthalmologist d/t insurance and she is now seeing Dr. Harlon Flor (or someone in that office).  She states that she had the VF testing and she was told the optic nerve was still swollen but VF okay.  She states that she is on K supplement now.    She notes mild assymetry in the face.  She notes that the right side of face seems to be "less meaty" than the L.  She does note a hx of hemifacial  spasm on the right.  Hasn't had issues with that in 2 years.  Thinks that it may be getting better (the asymmetry) but not sure.  Notes that "one of my eyes seems droopy."    CURRENT MEDICATIONS:  Outpatient Encounter Medications as of 11/29/2020  Medication Sig   acetaZOLAMIDE ER (DIAMOX) 500 MG capsule acetazolamide ER 500 mg capsule,extended release  Take 1 capsule twice a day by oral route.   Ascorbic Acid (VITAMIN C) 1000 MG tablet Take 1,000 mg by mouth daily.   Cholecalciferol (DIALYVITE VITAMIN D 5000 PO) Take 5,000 Units by mouth daily.   potassium chloride (KLOR-CON) 10 MEQ tablet Take 10 mEq by mouth daily.   vitamin B-12 (CYANOCOBALAMIN) 1000 MCG tablet Take 1,000 mcg by mouth daily.   warfarin (COUMADIN) 3 MG tablet Take 9 mg by mouth daily.   [DISCONTINUED] acetaZOLAMIDE (DIAMOX) 500 MG capsule Take 1 capsule (500 mg total) by mouth 2 (two) times daily. (Patient not taking: Reported on 11/29/2020)   No facility-administered encounter medications on file as  of 11/29/2020.     Objective:   PHYSICAL EXAMINATION:    VITALS:   Vitals:   11/29/20 0844  BP: 132/72  Pulse: 78  SpO2: 98%  Weight: 229 lb (103.9 kg)  Height: 5\' 5"  (1.651 m)      GEN:  Normal appears female in no acute distress.  Appears stated age. HEENT:  Normocephalic, atraumatic. The mucous membranes are moist. The superficial temporal arteries are without ropiness or tenderness. Cardiovascular: Regular rate and rhythm. Lungs: Clear to auscultation bilaterally. Neck/Heme: There are no carotid bruits noted bilaterally.   NEUROLOGICAL: Orientation:  The patient is alert and oriented x 3.   Cranial nerves: NL folds equal at rest and mostly with activation (but she notes some droop).   she has mild ptosis on the L. Fundoscopic attempted but disc margins look good on the L, not well visualized on R.  Extraocular muscles are intact and visual fields are full to confrontational testing. Speech is fluent and clear. Soft palate rises symmetrically and there is no tongue deviation. Hearing is intact to conversational tone. Tone: Tone is good throughout. Sensation: Sensation is intact to light touch x 4 Coordination:  The patient has no difficulty with RAM's or FNF bilaterally. Motor: Strength is 5/5 in the bilateral upper and lower extremities.  Shoulder shrug is equal and symmetric. There is no pronator drift.  There are no fasciculations noted.     Total time spent on today's visit was 30 minutes, including both face-to-face time and nonface-to-face time.  Time included that spent on review of records (prior notes available to me/labs/imaging if pertinent), discussing treatment and goals, answering patient's questions and coordinating care.  Cc:  Nche, , NP

## 2020-11-29 ENCOUNTER — Ambulatory Visit (INDEPENDENT_AMBULATORY_CARE_PROVIDER_SITE_OTHER): Payer: Managed Care, Other (non HMO) | Admitting: Neurology

## 2020-11-29 ENCOUNTER — Other Ambulatory Visit: Payer: Self-pay

## 2020-11-29 ENCOUNTER — Encounter: Payer: Self-pay | Admitting: Neurology

## 2020-11-29 ENCOUNTER — Other Ambulatory Visit (INDEPENDENT_AMBULATORY_CARE_PROVIDER_SITE_OTHER): Payer: Managed Care, Other (non HMO)

## 2020-11-29 VITALS — BP 132/72 | HR 78 | Ht 65.0 in | Wt 229.0 lb

## 2020-11-29 DIAGNOSIS — Z5181 Encounter for therapeutic drug level monitoring: Secondary | ICD-10-CM | POA: Diagnosis not present

## 2020-11-29 DIAGNOSIS — H02401 Unspecified ptosis of right eyelid: Secondary | ICD-10-CM | POA: Diagnosis not present

## 2020-11-29 DIAGNOSIS — G932 Benign intracranial hypertension: Secondary | ICD-10-CM

## 2020-11-29 LAB — COMPREHENSIVE METABOLIC PANEL
ALT: 19 U/L (ref 0–35)
AST: 19 U/L (ref 0–37)
Albumin: 4.2 g/dL (ref 3.5–5.2)
Alkaline Phosphatase: 85 U/L (ref 39–117)
BUN: 13 mg/dL (ref 6–23)
CO2: 23 mEq/L (ref 19–32)
Calcium: 8.8 mg/dL (ref 8.4–10.5)
Chloride: 111 mEq/L (ref 96–112)
Creatinine, Ser: 0.89 mg/dL (ref 0.40–1.20)
GFR: 78.74 mL/min (ref >60.00)
Glucose, Bld: 96 mg/dL (ref 70–99)
Potassium: 3.7 mEq/L (ref 3.5–5.1)
Sodium: 140 mEq/L (ref 135–145)
Total Bilirubin: 0.3 mg/dL (ref 0.2–1.2)
Total Protein: 7.3 g/dL (ref 6.0–8.3)

## 2020-11-29 LAB — TSH: TSH: 0.85 u[IU]/mL (ref 0.35–5.50)

## 2020-11-29 NOTE — Patient Instructions (Signed)
Your provider has requested that you have labwork completed today. The lab is located on the Second floor at [address], within the [facility] office. When you get off the elevator, turn right and go in the [facility] [address]; the first brown door on the left.  Tell the ladies behind the desk that you are there for lab work. If you are not called within 15 minutes please check with the front desk.   Once you complete your labs you are free to go. You will receive a call or message via MyChart with your lab results.    

## 2020-12-02 ENCOUNTER — Telehealth: Payer: Self-pay | Admitting: Nurse Practitioner

## 2020-12-02 DIAGNOSIS — Z862 Personal history of diseases of the blood and blood-forming organs and certain disorders involving the immune mechanism: Secondary | ICD-10-CM

## 2020-12-02 DIAGNOSIS — Z86711 Personal history of pulmonary embolism: Secondary | ICD-10-CM

## 2020-12-03 NOTE — Telephone Encounter (Signed)
Pt states her previous hematologist is in [place] and she needs to establish care with one locally but was informed by the office she called that she would need a referral placed by her PCP.

## 2020-12-06 LAB — MYASTHENIA GRAVIS PANEL 1
A CHR BINDING ABS: <0.3 nmol/L
STRIATED MUSCLE AB SCREEN: NEGATIVE

## 2020-12-06 NOTE — Addendum Note (Signed)
Addended by: Michaela Corner on: 12/06/2020 08:35 AM   Modules accepted: Orders

## 2020-12-10 ENCOUNTER — Telehealth: Payer: Self-pay | Admitting: Hematology

## 2020-12-10 NOTE — Telephone Encounter (Signed)
Scheduled appt per 10/24 referral. Pt is aware of appt date and time.  

## 2020-12-14 ENCOUNTER — Ambulatory Visit: Payer: Managed Care, Other (non HMO) | Admitting: Neurology

## 2020-12-23 ENCOUNTER — Inpatient Hospital Stay: Payer: Managed Care, Other (non HMO) | Attending: Hematology | Admitting: Hematology

## 2020-12-23 ENCOUNTER — Other Ambulatory Visit: Payer: Self-pay

## 2020-12-23 VITALS — BP 135/92 | HR 78 | Temp 97.0°F | Resp 17 | Wt 233.2 lb

## 2020-12-23 DIAGNOSIS — I2699 Other pulmonary embolism without acute cor pulmonale: Secondary | ICD-10-CM | POA: Diagnosis present

## 2020-12-23 DIAGNOSIS — Z6838 Body mass index (BMI) 38.0-38.9, adult: Secondary | ICD-10-CM | POA: Diagnosis not present

## 2020-12-23 DIAGNOSIS — E669 Obesity, unspecified: Secondary | ICD-10-CM | POA: Diagnosis not present

## 2020-12-23 DIAGNOSIS — Z86711 Personal history of pulmonary embolism: Secondary | ICD-10-CM | POA: Diagnosis not present

## 2020-12-23 DIAGNOSIS — Z7901 Long term (current) use of anticoagulants: Secondary | ICD-10-CM | POA: Diagnosis not present

## 2020-12-23 DIAGNOSIS — Z8249 Family history of ischemic heart disease and other diseases of the circulatory system: Secondary | ICD-10-CM | POA: Insufficient documentation

## 2020-12-23 DIAGNOSIS — G4733 Obstructive sleep apnea (adult) (pediatric): Secondary | ICD-10-CM | POA: Diagnosis not present

## 2020-12-23 NOTE — Progress Notes (Addendum)
[facility]   HEMATOLOGY/ONCOLOGY CONSULTATION NOTE  Date of Service: 12/23/2020  Patient Care Team: Nche, Charlene Brooke, NP as PCP - General (Internal Medicine) Tat, Eustace Quail, DO as Consulting Physician (Neurology)  CHIEF COMPLAINTS/PURPOSE OF CONSULTATION:  Pulmonary embolism  HISTORY OF PRESENTING ILLNESS:   Theresa Riley is a wonderful 45 y.o. female who has been referred to [place] by Dr .Lorayne Marek, Charlene Brooke, NP for evaluation and management of pulmonary embolism.  Patient has a history of obesity .Body mass index is 38.81 kg/m.,  Struct of sleep apnea, pseudotumor cerebri who has a history of significant pulmonary embolism in 2014 with a recurrence in 2017.  Patient was followed by Dr. Elvina Mattes from hematology in [place].   She reports that she has been on warfarin since 2014 and that she has been recommended to be on lifelong blood thinners for 2 significant pulmonary emboli without any clear defining provoking event. She continues to be on warfarin and notes that she does INR self checks at home and has been following with her primary care physician since she moved to [place] from [place]. She notes no issues with bleeding on warfarin and notes that her levels have been fairly stable. She does not recollect if she has had a hypercoagulable work-up. She report that she did have a hypercoagulable work-up and 2014 and this was unrevealing. Patient notes that she has preferred to be on anticoagulant that can be measured since it is important for her to know that she is adequately anticoagulated.   MEDICAL HISTORY:  Past Medical History:  Diagnosis Date   Pulmonary emboli ([place])    Yeast infection   Pseudotumor cerebri by Dr. Wells Guiles [facility] neurology Varicose veins vascular surgery Obstructive sleep apnea Obesity .Body mass index is 38.81 kg/m. In 2014 and 2017 pulmonary embolism on warfarin-follows with hematology in [place]  Dr. Tawanna Sat (hematology), Dr. Marsh Dolly  (neurology), Dr. Maryagnes Amos (PCP),   History of PE as below First diagnosed 2014,(year of warfarin)  repeat 2017   CT angio chest 2014 Pulmonary embolus extending to all lobes of both lungs,  primarily segmental and subsegmental in nature, though also seen  within the right main pulmonary artery, extending distally.  Relatively large clot burden noted.   CT chest 2017, acutely SOB Bilateral pulmonary emboli with findings suggestive of right heart strain as described.  -Lower extremity venous Doppler at that time no  DVT"  SURGICAL HISTORY: Past Surgical History:  Procedure Laterality Date   WISDOM TOOTH EXTRACTION      SOCIAL HISTORY: Social History   Socioeconomic History   Marital status: Single    Spouse name: Not on file   Number of children: 2   Years of education: Not on file   Highest education level: Not on file  Occupational History   Not on file  Tobacco Use   Smoking status: Never   Smokeless tobacco: Never  Vaping Use   Vaping Use: Never used  Substance and Sexual Activity   Alcohol use: No   Drug use: No   Sexual activity: Yes    Birth control/protection: Abstinence  Other Topics Concern   Not on file  Social History Narrative   Right Handed    Lives in a two story home    Social Determinants of Health   Financial Resource Strain: Not on file  Food Insecurity: Not on file  Transportation Needs: Not on file  Physical Activity: Not on file  Stress: Not on file  Social  Connections: Not on file  Intimate Partner Violence: Not on file    FAMILY HISTORY: Family History  Problem Relation Age of Onset   Pulmonary embolism Brother    Deep vein thrombosis Brother     ALLERGIES:  has No Known Allergies.  MEDICATIONS:  Current Outpatient Medications  Medication Sig Dispense Refill   acetaZOLAMIDE ER (DIAMOX) 500 MG capsule acetazolamide ER 500 mg capsule,extended release  Take 1 capsule twice a day by oral route.     Ascorbic Acid  (VITAMIN C) 1000 MG tablet Take 1,000 mg by mouth daily.     Cholecalciferol (DIALYVITE VITAMIN D 5000 PO) Take 5,000 Units by mouth daily.     potassium chloride (KLOR-CON) 10 MEQ tablet Take 10 mEq by mouth daily.     vitamin B-12 (CYANOCOBALAMIN) 1000 MCG tablet Take 1,000 mcg by mouth daily.     warfarin (COUMADIN) 3 MG tablet Take 9 mg by mouth daily.     No current facility-administered medications for this visit.    REVIEW OF SYSTEMS:    .10 Point review of Systems was done is negative except as noted above.   PHYSICAL EXAMINATION: ECOG PERFORMANCE STATUS: 1 - Symptomatic but completely ambulatory  . Vitals:   12/23/20 1113  BP: (!) 135/92  Pulse: 78  Resp: 17  Temp: (!) 97 F (36.1 C)  SpO2: 100%   Filed Weights   12/23/20 1113  Weight: 233 lb 4 oz (105.8 kg)   .Body mass index is 38.81 kg/m.  GENERAL:alert, in no acute distress and comfortable SKIN: no acute rashes, no significant lesions EYES: conjunctiva are pink and non-injected, sclera anicteric OROPHARYNX: MMM, no exudates, no oropharyngeal erythema or ulceration NECK: supple, no JVD LYMPH:  no palpable lymphadenopathy in the cervical, axillary or inguinal regions LUNGS: clear to auscultation b/l with normal respiratory effort HEART: regular rate & rhythm ABDOMEN:  normoactive bowel sounds , non tender, not distended. Extremity: no pedal edema PSYCH: alert & oriented x 3 with fluent speech NEURO: no focal motor/sensory deficits  LABORATORY DATA:  I have reviewed the data as listed  . CBC Latest Ref Rng & Units 12/19/2019 09/23/2018 06/02/2012  WBC 4.0 - 10.5 K/uL 4.6 4.0 4.7  Hemoglobin 12.0 - 15.0 g/dL 31.5 17.6 10.4(L)  Hematocrit 36.0 - 46.0 % 41.4 41.4 31.1(L)  Platelets 150 - 400 K/uL 274 282 288    . CMP Latest Ref Rng & Units 11/29/2020 12/19/2019 09/23/2018  Glucose 70 - 99 mg/dL 96 94 160(V)  BUN 6 - 23 mg/dL 13 8 10   Creatinine 0.40 - 1.20 mg/dL 3.71 0.62  Sodium 135 - 145 mEq/L  140 140 140  Potassium 3.5 - 5.1 mEq/L 3.7 3.6 4.1  Chloride 96 - 112 mEq/L 111 108 104  CO2 19 - 32 mEq/L 23 25 28   Calcium 8.4 - 10.5 mg/dL 8.8 9.4 9.1  Total Protein 6.0 - 8.3 g/dL 7.3 - 7.7  Total Bilirubin 0.2 - 1.2 mg/dL 0.3 - 0.5  Alkaline Phos 39 - 117 U/L 85 - 91  AST 0 - 37 U/L 19 - 23  ALT 0 - 35 U/L 19 - 21     RADIOGRAPHIC STUDIES: I have personally reviewed the radiological images as listed and agreed with the findings in the report. CTA chest 06/02/2012  IMPRESSION:   1.  Pulmonary embolus extending to all lobes of both lungs,  primarily segmental and subsegmental in nature, though also seen  within the right main pulmonary artery, extending  distally.  Relatively large clot burden noted.  2.  Lungs clear bilaterally.  3.  Suspect tiny hiatal hernia.   Lower extremity venous duplex 06/02/2012: Summary:   - No evidence of deep vein thrombosis involving the right    lower extremity and left lower extremity.  - No evidence of Baker's cyst on the right or left.  Other specific details can be found in the table(s) above.     Prepared and Electronically Authenticated by   Elam Dutch  [id number]  Ultrasound lower extremity venous reflux 10/13/2020.  Summary:  Left:  - No evidence of deep vein thrombosis seen in the left lower extremity,  from the common femoral through the popliteal veins.  - No evidence of superficial venous thrombosis in the left lower  extremity.  - There is no evidence of venous reflux seen in the left lower extremity.     ASSESSMENT & PLAN:   42 old female with  #1 history of recurrent unprovoked significant pulmonary embolism and 2014 and 2017. Reported hypercoagulable work-up in 2014 unrevealing.  A significant part of this was found in our labs and confirmed.  Factor V Leiden negative.  No prothrombin gene mutation was found and are available labs. Plan -Patient has been on warfarin since 2014 and has been  recommended lifelong anticoagulation by her hematologist and [place] Dr. Fonnie Birkenhead. -In the absence of modifiable provoking factors I would agree that the patient needs to be on long-term anticoagulation for significant unprovoked pulmonary emboli. -Patient prefers to be on Coumadin since she prefers knowing exactly what her levels are.  She notes that she would not want to change to Eliquis or Xarelto. -She does INR checks at home and has been following with her primary care physician since she moved here from [place]. -We discussed that we do not do routine INR monitoring and management for Coumadin and that we would recommend she continue follow-up with her primary care physician for monitoring and adjustment of her Coumadin levels. -We shall be available if any other questions or concerns arise. -Would recommend primary care physician check prothrombin gene mutation testing with next labs.  Patient did not want to get labs today. -We shall see her back as needed.  Follow-up  return to clinic with primary care physician for continued Coumadin monitoring and management  All of the patients questions were answered with apparent satisfaction. The patient knows to call the clinic with any problems, questions or concerns.  I spent 30 minutes counseling the patient face to face. The total time spent in the appointment was 45 minutes and more than 50% was on counseling and direct patient cares.    Sullivan Lone MD MS AAHIVMS [facility] Hematology/Oncology Physician [place] 12/23/2020 11:28 AM

## 2020-12-30 ENCOUNTER — Ambulatory Visit: Payer: Managed Care, Other (non HMO) | Admitting: Neurology

## 2020-12-31 ENCOUNTER — Other Ambulatory Visit: Payer: Self-pay | Admitting: Neurology

## 2020-12-31 DIAGNOSIS — G932 Benign intracranial hypertension: Secondary | ICD-10-CM

## 2021-02-14 ENCOUNTER — Encounter: Payer: Self-pay | Admitting: Neurology

## 2021-02-16 ENCOUNTER — Encounter: Payer: Self-pay | Admitting: Nurse Practitioner

## 2021-02-16 ENCOUNTER — Ambulatory Visit (INDEPENDENT_AMBULATORY_CARE_PROVIDER_SITE_OTHER): Payer: Managed Care, Other (non HMO) | Admitting: Nurse Practitioner

## 2021-02-16 ENCOUNTER — Other Ambulatory Visit: Payer: Self-pay

## 2021-02-16 VITALS — BP 124/86 | HR 78 | Temp 97.1°F | Ht 65.5 in | Wt 228.8 lb

## 2021-02-16 DIAGNOSIS — Z86711 Personal history of pulmonary embolism: Secondary | ICD-10-CM

## 2021-02-16 DIAGNOSIS — G932 Benign intracranial hypertension: Secondary | ICD-10-CM | POA: Diagnosis not present

## 2021-02-16 DIAGNOSIS — Z0001 Encounter for general adult medical examination with abnormal findings: Secondary | ICD-10-CM | POA: Diagnosis not present

## 2021-02-16 DIAGNOSIS — Z13220 Encounter for screening for lipoid disorders: Secondary | ICD-10-CM | POA: Diagnosis not present

## 2021-02-16 DIAGNOSIS — Z136 Encounter for screening for cardiovascular disorders: Secondary | ICD-10-CM | POA: Diagnosis not present

## 2021-02-16 DIAGNOSIS — H4710 Unspecified papilledema: Secondary | ICD-10-CM | POA: Diagnosis not present

## 2021-02-16 DIAGNOSIS — E876 Hypokalemia: Secondary | ICD-10-CM

## 2021-02-16 DIAGNOSIS — G4733 Obstructive sleep apnea (adult) (pediatric): Secondary | ICD-10-CM | POA: Diagnosis not present

## 2021-02-16 LAB — COMPREHENSIVE METABOLIC PANEL
ALT: 23 U/L (ref 0–35)
AST: 20 U/L (ref 0–37)
Albumin: 4.1 g/dL (ref 3.5–5.2)
Alkaline Phosphatase: 88 U/L (ref 39–117)
BUN: 13 mg/dL (ref 6–23)
CO2: 22 mEq/L (ref 19–32)
Calcium: 9 mg/dL (ref 8.4–10.5)
Chloride: 111 mEq/L (ref 96–112)
Creatinine, Ser: 0.81 mg/dL (ref 0.40–1.20)
GFR: 88.03 mL/min (ref >60.00)
Glucose, Bld: 98 mg/dL (ref 70–99)
Potassium: 3.4 mEq/L — ABNORMAL LOW (ref 3.5–5.1)
Sodium: 139 mEq/L (ref 135–145)
Total Bilirubin: 0.4 mg/dL (ref 0.2–1.2)
Total Protein: 7.5 g/dL (ref 6.0–8.3)

## 2021-02-16 LAB — LIPID PANEL
Cholesterol: 157 mg/dL (ref 0–200)
HDL: 33.6 mg/dL — ABNORMAL LOW (ref >39.00)
LDL Cholesterol: 106 mg/dL — ABNORMAL HIGH (ref 0–99)
NonHDL: 123.88
Total CHOL/HDL Ratio: 5
Triglycerides: 87 mg/dL (ref 0.0–149.0)
VLDL: 17.4 mg/dL (ref 0.0–40.0)

## 2021-02-16 LAB — CBC WITH DIFFERENTIAL/PLATELET
Basophils Absolute: 0 10*3/uL (ref 0.0–0.1)
Basophils Relative: 0.7 % (ref 0.0–3.0)
Eosinophils Absolute: 0.1 10*3/uL (ref 0.0–0.7)
Eosinophils Relative: 2.6 % (ref 0.0–5.0)
HCT: 40.4 % (ref 36.0–46.0)
Hemoglobin: 13.1 g/dL (ref 12.0–15.0)
Lymphocytes Relative: 32.5 % (ref 12.0–46.0)
Lymphs Abs: 1.1 10*3/uL (ref 0.7–4.0)
MCHC: 32.3 g/dL (ref 30.0–36.0)
MCV: 97.3 fl (ref 78.0–100.0)
Monocytes Absolute: 0.3 10*3/uL (ref 0.1–1.0)
Monocytes Relative: 7.3 % (ref 3.0–12.0)
Neutro Abs: 2 10*3/uL (ref 1.4–7.7)
Neutrophils Relative %: 56.9 % (ref 43.0–77.0)
Platelets: 259 10*3/uL (ref 150.0–400.0)
RBC: 4.15 Mil/uL (ref 3.87–5.11)
RDW: 14 % (ref 11.5–15.5)
WBC: 3.5 10*3/uL — ABNORMAL LOW (ref 4.0–10.5)

## 2021-02-16 LAB — TSH: TSH: 1 u[IU]/mL (ref 0.35–5.50)

## 2021-02-16 NOTE — Telephone Encounter (Signed)
Spoke to associate at [facility] at [phone]. They are faxing over the last notes for this patient

## 2021-02-16 NOTE — Assessment & Plan Note (Signed)
Request referral to [facility] for repeat sleep study. Last sleep study 1year ago

## 2021-02-16 NOTE — Assessment & Plan Note (Signed)
Reports increased pressure in right eye. Last appt with [facility] 06/2020. She is requesting to transfer care to [facility]

## 2021-02-16 NOTE — Assessment & Plan Note (Signed)
Current use of coumadin, managed by hematology in [place] per patient. She plans to establish care with hematologist who is willing to manage coumadin. I informed her that coumadin is typically managed by our coumadin clinic, but she stated she prefers it managed by hematology. Denies any CP, SOB, palpitations, GI/GU bleed, or bruising.

## 2021-02-16 NOTE — Patient Instructions (Signed)
Have ophthalmology report faxed to me Sign medical release form to get records from GYN. Go to lab for blood draw Maintain DASH diet and regular exercise.  Preventive Care 76-45 Years Old, Female Preventive care refers to lifestyle choices and visits with your health care provider that can promote health and wellness. Preventive care visits are also called wellness exams. What can I expect for my preventive care visit? Counseling Your health care provider may ask you questions about your: Medical history, including: Past medical problems. Family medical history. Pregnancy history. Current health, including: Menstrual cycle. Method of birth control. Emotional well-being. Home life and relationship well-being. Sexual activity and sexual health. Lifestyle, including: Alcohol, nicotine or tobacco, and drug use. Access to firearms. Diet, exercise, and sleep habits. Work and work Statistician. Sunscreen use. Safety issues such as seatbelt and bike helmet use. Physical exam Your health care provider will check your: Height and weight. These may be used to calculate your BMI (body mass index). BMI is a measurement that tells if you are at a healthy weight. Waist circumference. This measures the distance around your waistline. This measurement also tells if you are at a healthy weight and may help predict your risk of certain diseases, such as type 2 diabetes and high blood pressure. Heart rate and blood pressure. Body temperature. Skin for abnormal spots. What immunizations do I need? Vaccines are usually given at various ages, according to a schedule. Your health care provider will recommend vaccines for you based on your age, medical history, and lifestyle or other factors, such as travel or where you work. What tests do I need? Screening Your health care provider may recommend screening tests for certain conditions. This may include: Lipid and cholesterol levels. Diabetes screening.  This is done by checking your blood sugar (glucose) after you have not eaten for a while (fasting). Pelvic exam and Pap test. Hepatitis B test. Hepatitis C test. HIV (human immunodeficiency virus) test. STI (sexually transmitted infection) testing, if you are at risk. Lung cancer screening. Colorectal cancer screening. Mammogram. Talk with your health care provider about when you should start having regular mammograms. This may depend on whether you have a family history of breast cancer. BRCA-related cancer screening. This may be done if you have a family history of breast, ovarian, tubal, or peritoneal cancers. Bone density scan. This is done to screen for osteoporosis. Talk with your health care provider about your test results, treatment options, and if necessary, the need for more tests. Follow these instructions at home: Eating and drinking  Eat a diet that includes fresh fruits and vegetables, whole grains, lean protein, and low-fat dairy products. Take vitamin and mineral supplements as recommended by your health care provider. Do not drink alcohol if: Your health care provider tells you not to drink. You are pregnant, may be pregnant, or are planning to become pregnant. If you drink alcohol: Limit how much you have to 0-1 drink a day. Know how much alcohol is in your drink. In the [place] one drink equals one 12 oz bottle of beer (355 mL), one 5 oz glass of wine (148 mL), or one 1 oz glass of hard liquor (44 mL). Lifestyle Brush your teeth every morning and night with fluoride toothpaste. Floss one time each day. Exercise for at least 30 minutes 5 or more days each week. Do not use any products that contain nicotine or tobacco. These products include cigarettes, chewing tobacco, and vaping devices, such as e-cigarettes. If you need help  quitting, ask your health care provider. Do not use drugs. If you are sexually active, practice safe sex. Use a condom or other form of protection  to prevent STIs. If you do not wish to become pregnant, use a form of birth control. If you plan to become pregnant, see your health care provider for a prepregnancy visit. Take aspirin only as told by your health care provider. Make sure that you understand how much to take and what form to take. Work with your health care provider to find out whether it is safe and beneficial for you to take aspirin daily. Find healthy ways to manage stress, such as: Meditation, yoga, or listening to music. Journaling. Talking to a trusted person. Spending time with friends and family. Minimize exposure to UV radiation to reduce your risk of skin cancer. Safety Always wear your seat belt while driving or riding in a vehicle. Do not drive: If you have been drinking alcohol. Do not ride with someone who has been drinking. When you are tired or distracted. While texting. If you have been using any mind-altering substances or drugs. Wear a helmet and other protective equipment during sports activities. If you have firearms in your house, make sure you follow all gun safety procedures. Seek help if you have been physically or sexually abused. What's next? Visit your health care provider once a year for an annual wellness visit. Ask your health care provider how often you should have your eyes and teeth checked. Stay up to date on all vaccines. This information is not intended to replace advice given to you by your health care provider. Make sure you discuss any questions you have with your health care provider. Document Revised: 07/28/2020 Document Reviewed: 07/28/2020 Elsevier Patient Education  [place].

## 2021-02-16 NOTE — Progress Notes (Signed)
Subjective:    Patient ID: Theresa Riley, female    DOB: [birth year], 46 y.o.   MRN: [id number]  Patient presents today for CPE and eval of chronic conditions  HPI OSA (obstructive sleep apnea) Request referral to [facility] for repeat sleep study. Last sleep study 1year ago   Papilledema Reports increased pressure in right eye. Last appt with [place] [facility] 06/2020. She is requesting to transfer care to [facility]   History of pulmonary embolism Current use of coumadin, managed by hematology in [place] per patient. She plans to establish care with hematologist who is willing to manage coumadin. I informed her that coumadin is typically managed by our coumadin clinic, but she stated she prefers it managed by hematology. Denies any CP, SOB, palpitations, GI/GU bleed, or bruising.  Vision:had eye appt with [place] [facility] 06/2020 and  Dental:will schedule Diet:regular Exercise:walking 2x/week Weight:  Wt Readings from Last 3 Encounters:  02/16/21 228 lb 12.8 oz (103.8 kg)  12/23/20 233 lb 4 oz (105.8 kg)  11/29/20 229 lb (103.9 kg)    Sexual History (orientation,birth control, marital status, STD):breast and pelvic exam completed by GYN, up to date with PAP and mammogram  Depression/Suicide: Depression screen [facility] 2/9 02/16/2021  Decreased Interest 0  Down, Depressed, Hopeless 0  PHQ - 2 Score 0  Altered sleeping 0  Tired, decreased energy 1  Change in appetite 1  Feeling bad or failure about yourself  0  Trouble concentrating 0  Moving slowly or fidgety/restless 0  Suicidal thoughts 0  PHQ-9 Score 2  Difficult doing work/chores Not difficult at all   Immunizations: (TDAP, Hep C screen, Pneumovax, Influenza, zoster)  Health Maintenance  Topic Date Due   Hepatitis C Screening: USPSTF Recommendation to screen - Ages 42-79 yo.  Never done   COVID-19 Vaccine (3 - Pfizer risk series) 02/03/2020   Flu Shot  05/13/2021*   Pneumococcal Vaccination (1 - PCV) 02/16/2022*    Tetanus Vaccine  02/16/2022*   Pap Smear  07/21/2021   HIV Screening  Completed   HPV Vaccine  Aged Out  *Topic was postponed. The date shown is not the original due date.   Fall Risk: Fall Risk  11/29/2020 07/15/2020 06/15/2020  Falls in the past year? 0 0 1  Number falls in past yr: - 0 0  Injury with Fall? 0 0 0  Risk for fall due to : - - History of fall(s)  Follow up - - Falls evaluation completed   Medications and allergies reviewed with patient and updated if appropriate.  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  Cholecalciferol (DIALYVITE VITAMIN D 5000 PO) Take 5,000 Units by mouth daily.     potassium chloride (KLOR-CON) 10 MEQ tablet Take 10 mEq by mouth daily.     vitamin B-12 (CYANOCOBALAMIN) 1000 MCG tablet Take 1,000 mcg by mouth daily.     warfarin (COUMADIN) 3 MG tablet Take 9 mg by mouth daily.     No current facility-administered medications on file prior to visit.    Past Medical History:  Diagnosis Date   Pulmonary emboli (HCC)    Yeast infection     Past  Surgical History:  Procedure Laterality Date   WISDOM TOOTH EXTRACTION      Social History   Socioeconomic History   Marital status: Single    Spouse name: Not on file   Number of children: 2   Years of education: Not on file   Highest education level: Not on file  Occupational History   Not on file  Tobacco Use   Smoking status: Never   Smokeless tobacco: Never  Vaping Use   Vaping Use: Never used  Substance and Sexual Activity   Alcohol use: No   Drug use: No   Sexual activity: Yes    Birth control/protection: Abstinence  Other Topics Concern   Not on file  Social History Narrative   Right Handed    Lives in a two story home    Social Determinants of Health   Financial Resource Strain: Not on file  Food Insecurity: Not on file  Transportation Needs: Not on file  Physical Activity: Not on file  Stress: Not on file  Social Connections: Not on file    Family History  Problem Relation Age of Onset   Pulmonary embolism Brother    Deep vein thrombosis Brother         Review of Systems  Constitutional:  Negative for fever, malaise/fatigue and weight loss.  HENT:  Negative for congestion and sore throat.   Eyes:        Negative for visual changes  Respiratory:  Negative for cough and shortness of breath.   Cardiovascular:  Negative for chest pain, palpitations and leg swelling.  Gastrointestinal:  Negative for blood in stool, constipation, diarrhea and heartburn.  Genitourinary:  Negative for dysuria, frequency and urgency.  Musculoskeletal:  Negative for falls, joint pain and myalgias.  Skin:  Negative for rash.  Neurological:  Negative for dizziness, sensory change and headaches.  Endo/Heme/Allergies:  Does not bruise/bleed easily.  Psychiatric/Behavioral:  Negative for depression, substance abuse and suicidal ideas. The patient is not nervous/anxious.    Objective:   Vitals:   02/16/21 0938  BP: 124/86  Pulse: 78  Temp: (!) 97.1 F (36.2 C)  SpO2:  98%    Body mass index is 37.5 kg/m.   Physical Examination:  Physical Exam Vitals and nursing note reviewed.  Constitutional:      General: She is not in acute distress.    Appearance: She is well-developed.  HENT:     Right Ear: Tympanic membrane, ear canal and external ear normal.     Left Ear: Tympanic membrane, ear canal and external ear normal.  Eyes:     Extraocular Movements: Extraocular movements intact.     Conjunctiva/sclera: Conjunctivae normal.  Cardiovascular:     Rate and Rhythm: Normal rate and regular rhythm.     Heart sounds: Normal heart sounds.  Pulmonary:     Effort: Pulmonary effort is normal. No respiratory distress.     Breath sounds:  Normal breath sounds.  Chest:     Chest wall: No tenderness.  Abdominal:     General: Bowel sounds are normal.     Palpations: Abdomen is soft.  Musculoskeletal:        General: Normal range of motion.     Right lower leg: No edema.     Left lower leg: No edema.  Skin:    General: Skin is warm and dry.  Neurological:     Mental Status: She is alert and oriented to person, place, and time.     Deep Tendon Reflexes: Reflexes are normal and symmetric.  Psychiatric:        Mood and Affect: Mood normal.        Behavior: Behavior normal.        Thought Content: Thought content normal.    ASSESSMENT and PLAN: This visit occurred during the SARS-CoV-2 public health emergency.  Safety protocols were in place, including screening questions prior to the visit, additional usage of staff PPE, and extensive cleaning of exam room while observing appropriate contact time as indicated for disinfecting solutions.   Roann was seen today for annual exam.  Diagnoses and all orders for this visit:  Encounter for preventative adult health care exam with abnormal findings -     Comprehensive metabolic panel -     CBC with Differential/Platelet -     Lipid panel -     TSH  OSA (obstructive sleep apnea) -     Ambulatory  referral to Neurology  Idiopathic intracranial hypertension -     Ambulatory referral to Neurology  Papilledema -     Ambulatory referral to Neurology  Encounter for lipid screening for cardiovascular disease -     Lipid panel  History of pulmonary embolism      Problem List Items Addressed This Visit       Respiratory   OSA (obstructive sleep apnea)    Request referral to [facility] for repeat sleep study. Last sleep study 1year ago       Relevant Orders   Ambulatory referral to Neurology     Nervous and Auditory   Idiopathic intracranial hypertension   Relevant Orders   Ambulatory referral to Neurology   Papilledema    Reports increased pressure in right eye. Last appt with [place] ophthalmology 06/2020. She is requesting to transfer care to [facility]       Relevant Orders   Ambulatory referral to Neurology     Other   History of pulmonary embolism    Current use of coumadin, managed by hematology in [place] per patient. She plans to establish care with hematologist who is willing to manage coumadin. I informed her that coumadin is typically managed by our coumadin clinic, but she stated she prefers it managed by hematology. Denies any CP, SOB, palpitations, GI/GU bleed, or bruising.      Other Visit Diagnoses     Encounter for preventative adult health care exam with abnormal findings    -  Primary   Relevant Orders   Comprehensive metabolic panel   CBC with Differential/Platelet   Lipid panel   TSH   Encounter for lipid screening for cardiovascular disease       Relevant Orders   Lipid panel       Follow up: Return in about 1 year (around 02/16/2022) for CPE (fasting).  Alysia Penna, NP

## 2021-02-17 MED ORDER — POTASSIUM CHLORIDE ER 20 MEQ PO TBCR: 20.0000 meq | EXTENDED_RELEASE_TABLET | Freq: Every day | ORAL | 1 refills | Status: DC

## 2021-02-17 NOTE — Telephone Encounter (Signed)
Called patient and let her know I will call the eye doctor about the e VF Testing and they are sending it right away. Patient wanting to know if we can also refer her for sleep study for her sleep apnea

## 2021-02-17 NOTE — Telephone Encounter (Signed)
Please get pt an appt with Huntley Dec.  Please get VF testing from the eye doctor.  Huntley Dec, see me and we will talk about what to do.

## 2021-02-18 ENCOUNTER — Other Ambulatory Visit: Payer: Self-pay | Admitting: Physician Assistant

## 2021-02-18 DIAGNOSIS — G932 Benign intracranial hypertension: Secondary | ICD-10-CM

## 2021-02-18 MED ORDER — ACETAZOLAMIDE ER 500 MG PO CP12: ORAL_CAPSULE | ORAL | 0 refills | Status: DC

## 2021-02-25 ENCOUNTER — Ambulatory Visit: Payer: Managed Care, Other (non HMO) | Admitting: Physician Assistant

## 2021-02-25 ENCOUNTER — Other Ambulatory Visit: Payer: Self-pay

## 2021-02-25 ENCOUNTER — Encounter: Payer: Self-pay | Admitting: Physician Assistant

## 2021-02-25 ENCOUNTER — Other Ambulatory Visit (INDEPENDENT_AMBULATORY_CARE_PROVIDER_SITE_OTHER): Payer: Managed Care, Other (non HMO)

## 2021-02-25 VITALS — BP 132/89 | HR 72 | Resp 20 | Ht 65.0 in | Wt 228.0 lb

## 2021-02-25 DIAGNOSIS — H4710 Unspecified papilledema: Secondary | ICD-10-CM

## 2021-02-25 DIAGNOSIS — H02402 Unspecified ptosis of left eyelid: Secondary | ICD-10-CM

## 2021-02-25 DIAGNOSIS — G5131 Clonic hemifacial spasm, right: Secondary | ICD-10-CM

## 2021-02-25 DIAGNOSIS — G932 Benign intracranial hypertension: Secondary | ICD-10-CM | POA: Diagnosis not present

## 2021-02-25 DIAGNOSIS — H5711 Ocular pain, right eye: Secondary | ICD-10-CM

## 2021-02-25 NOTE — Progress Notes (Signed)
Assessment/Plan:   1.  Idiopathic intracranial hypertension (former pseudotumor cerebri)  Very few records from her prior neurologist in [place] were received.  She did have MRI of the brain in February, 2021 which was essentially unremarkable, with the exception of demonstrating slit like ventricles and enlarged empty sella. LP never done because patient on Coumadin. MRV never done, but if she had a history of central venous thrombosis, patient is already on the treatment for it (Coumadin) as she has a history of PE. Discussed with the patient that because she cannot have LP, we have to follow her through ophthalmologic testing. She had issues with insurance, has seen another ophthalmologist, no detailed records are available for review.  Diamox increased to 1000 mg am, 500 mg pm total 1500 mg daily on 02/18/21 with close monitoring of her potassium levels 10 days later, follow K with PCP. Continue Diamox at same dose Recommend Neurophthalmology evaluation at [facility] for detailed evaluation as patient continues to be symptomatic  Discussed value of weight loss even if modest  amount  2.  History of R hemifacial spasm       Suspect that the R  asymmetry she notes is from this. Twitch in the R forehead area noted on exam    Order MRI brain  will check AchR Ab as L eye with ptosis  R   3.  History of multiple PE's             On lifelong coumadin             Follow up with Heme   4.  F/u 3 months with Dr. Arbutus Leas    Subjective:   Theresa Riley was seen today in follow up for IIH.  My previous records as well as any outside records available were reviewed prior to todays visit.  We tried to get a copy of her records from [facility] in [place].  The records that came to [place] were quite unrevealing -an ambulatory EEG and sleep study really being the only thing that were sent.  MRI brain was sent with contrast.  No MRV was completed.  She is currently on Diamox, 1000 am, 500 pm since 1/6 due to  R behind the eye pain, without significant relief. SHe states that has been seen by a new ophthalmologist, [facility]   She states that she had the VF testing and she was told the optic nerve was still swollen but VF okay.  She states that she is on K supplement now.   She notes mild asymmetry in the face.  She notes that the right side of face seems to be "less meaty" than the L.  She does note a hx of hemifacial spasm on the right and it may be worse since her last visit. She reports that only chamomile may help "a little bit" at night. The R frontal twitching is independent of her  eye pressure. She reports " the R eye is droopy". No numbness or tingling, no dysarthria or dysphagia. Pt not very  interested in Botox due to bleeding risks.      CURRENT MEDICATIONS:  Outpatient Encounter Medications as of 02/25/2021  Medication Sig   acetaZOLAMIDE ER (DIAMOX) 500 MG capsule Take 2 tabs in the morning and 1 tab in the evening   Ascorbic Acid (VITAMIN C) 1000 MG tablet Take 1,000 mg by mouth daily.   Cholecalciferol (DIALYVITE VITAMIN D 5000 PO) Take 5,000 Units by mouth daily.   potassium chloride  20 MEQ TBCR Take 20 mEq by mouth daily.   vitamin B-12 (CYANOCOBALAMIN) 1000 MCG tablet Take 1,000 mcg by mouth daily.   warfarin (COUMADIN) 3 MG tablet Take 9 mg by mouth daily.   No facility-administered encounter medications on file as of 02/25/2021.     Objective:   PHYSICAL EXAMINATION:    VITALS:   Vitals:   02/25/21 1310  BP: 132/89  Pulse: 72  Resp: 20  SpO2: 98%  Weight: 228 lb (103.4 kg)  Height: 5\' 5"  (1.651 m)     GEN:  Normal appears female in no acute distress.  Appears stated age.Flat affect HEENT:  Normocephalic, atraumatic. The mucous membranes are moist. The superficial temporal arteries are without ropiness or tenderness. Cardiovascular: Regular rate and rhythm. Lungs: Clear to auscultation bilaterally. Neck/Heme: There are no carotid bruits noted bilaterally.    NEUROLOGICAL: Orientation:  The patient is alert and oriented x 3.   Cranial nerves: NL folds equal at rest and mostly with activation (but she notes some droop).   she has mild L  ptosis. Fundoscopic attempted but disc margins look good on the L, not well visualized on R.  Extraocular muscles are intact and visual fields are full to confrontational testing. Speech is fluent and clear. Soft palate rises symmetrically and there is no tongue deviation. Hearing is intact to conversational tone. Tone: Tone is good throughout. Sensation: Sensation is intact to light touch x 4 Coordination:  The patient has no difficulty with RAM's or FNF bilaterally. Motor: Strength is 5/5 in the bilateral upper and lower extremities.  Shoulder shrug is equal and symmetric. There is no pronator drift.  There are no fasciculations noted.     Total time spent on today's visit was 45 minutes, including both face-to-face time and nonface-to-face time.  Time included that spent on review of records (prior notes available to me/labs/imaging if pertinent), discussing treatment and goals, answering patient's questions and coordinating care.  Cc:  Nche, , NP

## 2021-02-25 NOTE — Patient Instructions (Addendum)
It was a pleasure to see you today at our office.   Recommendations:  Follow up in 3 months with Dr. Arbutus Leas  Continue Diamox 1000 mg morning and 500 mg evening Check Potassium today MRI brain  Referral to Neurophtalmology    RECOMMENDATIONS FOR ALL PATIENTS WITH MEMORY PROBLEMS: 1. Continue to exercise (Recommend 30 minutes of walking everyday, or 3 hours every week) 2. Increase social interactions - continue going to [place] and enjoy social gatherings with friends and family 3. Eat healthy, avoid fried foods and eat more fruits and vegetables 4. Maintain adequate blood pressure, blood sugar, and blood cholesterol level. Reducing the risk of stroke and cardiovascular disease also helps promoting better memory. 5. Avoid stressful situations. Live a simple life and avoid aggravations. Organize your time and prepare for the next day in anticipation. 6. Sleep well, avoid any interruptions of sleep and avoid any distractions in the bedroom that may interfere with adequate sleep quality 7. Avoid sugar, avoid sweets as there is a strong link between excessive sugar intake, diabetes, and cognitive impairment We discussed the Mediterranean diet, which has been shown to help patients reduce the risk of progressive memory disorders and reduces cardiovascular risk. This includes eating fish, eat fruits and green leafy vegetables, nuts like almonds and hazelnuts, walnuts, and also use olive oil. Avoid fast foods and fried foods as much as possible. Avoid sweets and sugar as sugar use has been linked to worsening of memory function.  There is always a concern of gradual progression of memory problems. If this is the case, then we may need to adjust level of care according to patient needs. Support, both to the patient and caregiver, should then be put into place.    FALL PRECAUTIONS: Be cautious when walking. Scan the area for obstacles that may increase the risk of trips and falls. When getting up in the  mornings, sit up at the edge of the bed for a few minutes before getting out of bed. Consider elevating the bed at the head end to avoid drop of blood pressure when getting up. Walk always in a well-lit room (use night lights in the walls). Avoid area rugs or power cords from appliances in the middle of the walkways. Use a walker or a cane if necessary and consider physical therapy for balance exercise. Get your eyesight checked regularly.  FINANCIAL OVERSIGHT: Supervision, especially oversight when making financial decisions or transactions is also recommended.  HOME SAFETY: Consider the safety of the kitchen when operating appliances like stoves, microwave oven, and blender. Consider having supervision and share cooking responsibilities until no longer able to participate in those. Accidents with firearms and other hazards in the house should be identified and addressed as well.   ABILITY TO BE LEFT ALONE: If patient is unable to contact 911 operator, consider using LifeLine, or when the need is there, arrange for someone to stay with patients. Smoking is a fire hazard, consider supervision or cessation. Risk of wandering should be assessed by caregiver and if detected at any point, supervision and safe proof recommendations should be instituted.  MEDICATION SUPERVISION: Inability to self-administer medication needs to be constantly addressed. Implement a mechanism to ensure safe administration of the medications.   DRIVING: Regarding driving, in patients with progressive memory problems, driving will be impaired. We advise to have someone else do the driving if trouble finding directions or if minor accidents are reported. Independent driving assessment is available to determine safety of driving.   If  you are interested in the driving assessment, you can contact the following:  [employer] in [place] [phone]  [facility] [phone]  [facility] [phone] or [phone]   We have sent a referral to [facility] for your MRI and they will call you directly to schedule your appointment. They are located at [address] [facility]. If you need to contact them directly please call [phone].   Your provider has requested that you have labwork completed today. Please go to [facility] ([id number]) on the second floor of this building before leaving the office today. You do not need to check in. If you are not called within 15 minutes please check with the front desk.

## 2021-02-28 LAB — SPECIMEN STATUS REPORT

## 2021-03-01 ENCOUNTER — Telehealth: Payer: Self-pay

## 2021-03-01 NOTE — Telephone Encounter (Signed)
The soonest I can get her in is July 21st,2023 at 9:00am with Dr.Timothy Daphine Deutscher.

## 2021-03-01 NOTE — Telephone Encounter (Signed)
Dr.Timothy Daphine Deutscher would not be able to see patient until July of 2023. Please advise on referral or to another Neuroopthamalogist.

## 2021-03-02 NOTE — Telephone Encounter (Signed)
Patient is scheduled with Dr.Tim Daphine Deutscher 09/02/2021 at 9:00am, tried to call patient voicemail left of details, packet from MD office to sent to here to fill out.

## 2021-03-04 ENCOUNTER — Encounter: Payer: Self-pay | Admitting: Physician Assistant

## 2021-03-04 ENCOUNTER — Telehealth: Payer: Self-pay

## 2021-03-04 NOTE — Telephone Encounter (Signed)
Noted and resent clinicals for urgent referral again to Dr. Theone Murdoch, patient notified and confirmation of fax received.

## 2021-03-07 NOTE — Telephone Encounter (Signed)
This referral has already been resent.

## 2021-03-07 NOTE — Telephone Encounter (Signed)
Patient called that wants it more urgent, if there is a chance that we can call Dr. Ermalene Searing office and placed her on a cancellation basis would be great, as she has apt with Dr. Arbutus Leas on 05/2021 thanks

## 2021-03-08 ENCOUNTER — Telehealth: Payer: Self-pay | Admitting: Neurology

## 2021-03-08 NOTE — Telephone Encounter (Signed)
Notes were resent the other day.

## 2021-03-08 NOTE — Telephone Encounter (Signed)
This was changed on referral for the neuro-ophthalmologist.

## 2021-03-08 NOTE — Telephone Encounter (Signed)
The following message was left with [facility] on 03/08/21 at 8:06AM.  Caller states she was referred to neuro-ophthalmologist, however she needs the referral to be changed to emergency so she does not have to wait until 09/02/21.

## 2021-03-09 ENCOUNTER — Ambulatory Visit
Admission: RE | Admit: 2021-03-09 | Discharge: 2021-03-09 | Disposition: A | Discharge status: Home / Self Care | Payer: Managed Care, Other (non HMO) | Source: Ambulatory Visit | Attending: Physician Assistant | Admitting: Physician Assistant

## 2021-03-09 ENCOUNTER — Other Ambulatory Visit: Payer: Self-pay

## 2021-03-09 MED ORDER — GADOBENATE DIMEGLUMINE 529 MG/ML IV SOLN
20.0000 mL | Freq: Once | INTRAVENOUS | Status: AC | PRN
  Administered 2021-03-09: 20 mL via INTRAVENOUS

## 2021-03-09 NOTE — Telephone Encounter (Signed)
Let pt know that she doesn't need neuro-ophthalmology.  She needs to get back with her prior ophthalmologist, Dr. Melene Muller, for repeat VF testing so we have comparison.  It may be best to wait about 2-3 months since her dose was just increased.

## 2021-03-10 NOTE — Telephone Encounter (Signed)
Patient returned a call about this. She was not sure who called her.

## 2021-03-11 NOTE — Telephone Encounter (Signed)
Called patient and verified she is seeing Dr. Harlon Flor in March and will continue to see Dr.  Arbutus Leas in April patient still wanting to see Neuro Opthalmology for the tremor in her left side of her face

## 2021-03-15 ENCOUNTER — Telehealth: Payer: Self-pay | Admitting: Physician Assistant

## 2021-03-15 NOTE — Telephone Encounter (Signed)
Patient called and said the neuro-ophthalmology does not have an urgent referral. They said it was received but not marked as urgent.  She has spoken with our office several times about this to no avail.  She said the office told her a phone call would be sufficient to them to increase urgency.  [phone], Dr. Jolyn Nap

## 2021-03-15 NOTE — Telephone Encounter (Signed)
Called patient to explain what we consider urgent. Patient upset tat her eye twitching has gotten worse and she considers it urgent. Patient upset and started to raise her voice to me at which time I did let her know this is a respectful conversation please not to raise her voice. Patient did not understand why Huntley Dec considered her eye twitching urgent but Dr. Arbutus Leas does not. I told patient we consider the pressure in her eye the Diamox being adjusted to be Urgent and we are treating it as such. Patient is reaching out to her PCP to get the referral and the urgent status she is looking for .

## 2021-03-16 ENCOUNTER — Other Ambulatory Visit: Payer: Self-pay | Admitting: Neurology

## 2021-03-16 DIAGNOSIS — G932 Benign intracranial hypertension: Secondary | ICD-10-CM

## 2021-03-17 ENCOUNTER — Other Ambulatory Visit: Payer: Self-pay

## 2021-05-03 ENCOUNTER — Encounter: Payer: Self-pay | Admitting: Nurse Practitioner

## 2021-05-09 ENCOUNTER — Telehealth: Payer: Self-pay | Admitting: Nurse Practitioner

## 2021-05-09 DIAGNOSIS — Z86711 Personal history of pulmonary embolism: Secondary | ICD-10-CM

## 2021-05-09 DIAGNOSIS — I87009 Postthrombotic syndrome without complications of unspecified extremity: Secondary | ICD-10-CM

## 2021-05-09 MED ORDER — WARFARIN SODIUM 3 MG PO TABS: ORAL_TABLET | ORAL | 0 refills | Status: DC

## 2021-05-09 NOTE — Telephone Encounter (Signed)
Conference call with Theresa Riley and Theresa Riley. ?Requested for order form to transfer reported INR results to me. Also requested for last 3 INR results. ?Theresa Riley provided verbal consent to [employer]. ?INR results are as follows: 2.6 on 03/22/21 at 7:08pm, 2.6 on 04/10/21 at 11:29pm, and 2.5 on 05/03/21 at 7pm. ?Theresa Riley will was informed to report INR results directly to me until order form with new provider is signed and returned to [employer]. She verbalized understanding ?

## 2021-05-17 ENCOUNTER — Ambulatory Visit (INDEPENDENT_AMBULATORY_CARE_PROVIDER_SITE_OTHER): Payer: Managed Care, Other (non HMO) | Admitting: Neurology

## 2021-05-17 ENCOUNTER — Encounter: Payer: Self-pay | Admitting: Neurology

## 2021-05-17 VITALS — BP 133/90 | HR 72 | Ht 65.0 in | Wt 230.5 lb

## 2021-05-17 DIAGNOSIS — M2619 Other specified anomalies of jaw-cranial base relationship: Secondary | ICD-10-CM

## 2021-05-17 DIAGNOSIS — H4710 Unspecified papilledema: Secondary | ICD-10-CM | POA: Diagnosis not present

## 2021-05-17 DIAGNOSIS — G4733 Obstructive sleep apnea (adult) (pediatric): Secondary | ICD-10-CM

## 2021-05-17 DIAGNOSIS — Z86711 Personal history of pulmonary embolism: Secondary | ICD-10-CM

## 2021-05-17 DIAGNOSIS — Z6835 Body mass index (BMI) 35.0-35.9, adult: Secondary | ICD-10-CM

## 2021-05-17 DIAGNOSIS — G932 Benign intracranial hypertension: Secondary | ICD-10-CM

## 2021-05-17 NOTE — Progress Notes (Signed)
? ? ?SLEEP MEDICINE CLINIC ?  ? ?Provider:  Melvyn Novas, MD  ?Primary Care Physician:  Anne Ng, NP ?[phone] ?[place] [address]  ? ?  ?Referring Provider: Anne Ng, Np ?[phone] ?[place],  [place] [address]  ? ? ?Primary neurologist : Kerin Salen, DO  ?  ?  ?    ?Chief Complaint according to patient   ?Patient presents with:  ?  ? New Patient (Initial Visit)  ?     ?  ?  ?HISTORY OF PRESENT ILLNESS:  ?Theresa Riley is a 46 y.o.  African American female patient seen here as a referral on 05/17/2021 from NP Nche for a Sleep Consultation, MD.  ?Chief concern according to patient :   ?  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  She  has a past medical history of Pulmonary emboli (2014- 2017 / on warfarin)HCC) and Yeast infection. ? ?  ?The patient had the first sleep study in the year 2021. Internal referral for OSA. Pt just moved from [facility] in 2021. Had previously sleep study around 2021. ? Dx with severe sleep apnea at that time. Was not able to complete titration study d/t insurance issues, never set up with CPAP.  ? ?Outside PSG from [place] , [place]: ?06-2020;  ?The patient was seen at the [facility] for sleep disorders, referring physician was Dr. Michaela Corner.  She was diagnosed with an overall AHI of 20.9 and REM AHI of 42.1/h non-REM sleep AHI was 14.8/h.  There were no significant periodic limb movements or snoring arousals.  She had 1 central apnea all other events were obstructive but the vast majority was hypopnea.  Heart rate was in normal variability, she slept supine only.  Her hypnogram shows 3 distinct REM sleep cycles, in rem sleep she had 14 desaturations per hour of REM sleep.  Nadir was 74%. ?  ?Sleep relevant medical history: Nocturia 3-5 times at night- fragmented sleep. "Every time I roll over" .has enuresis.  ?No TBI no ENT surgery.  Wisdom teeth 4 removed.  ?  ?Family medical /sleep history: mother is likely undiagnosed,  no other family member on CPAP with OSA.  ?Social history:  Patient is working as Public affairs consultant,  [employer]  2004 graduate, and lives in a household with  mother.  ?Family status is single.  ?The patient currently works full time, part from home, ?Tobacco use;none .  ETOH use ; rare ,  ?Caffeine intake ; none  ?  ?  ?Sleep habits are as follows: The patient's dinner time is between 8-9 PM. The patient goes to bed at 11-12 PM and continues to sleep for intervals 1-2 hours, wakes for many, many bathroom breaks.   ?The preferred sleep position is supine but she wakes up snoring, choking, , with the support of 1-pillow. Dreams are reportedly frequent/vivid.  ?7.30 AM is the usual rise time. The patient wakes up spontaneously with an alarm at 6.00.  ?She reports sometimes  not feeling refreshed or restored in AM. Naps are taken frequently, dozing off in meetings.    ?Review of Systems: ?Out of a complete 14 system review, the patient complains of only the following symptoms, and all other reviewed systems are negative.:  ?Fatigue, sleepiness , snoring, fragmented sleep, NOCTURIA>  ?  ?How likely are you to doze in the following  situations: ?0 = not likely, 1 = slight chance, 2 = moderate chance, 3 = high chance ?  ?Sitting and Reading? ?Watching Television? ?Sitting inactive in a public place (theater or meeting)? ?As a passenger in a car for an hour without a break? ?Lying down in the afternoon when circumstances permit? ?Sitting and talking to someone? ?Sitting quietly after lunch without alcohol? ?In a car, while stopped for a few minutes in traffic? ?  ?Total = 13/ 24 points  ? FSS endorsed at 21/ 63 points.  ? ?Social History  ? ?Socioeconomic History  ? Marital status: Single  ?  Spouse name: Not on file  ? Number of children: 2  ? Years of education: Not on file  ? Highest education level: Not on file  ?Occupational  History  ? Not on file  ?Tobacco Use  ? Smoking status: Never  ? Smokeless tobacco: Never  ?Vaping Use  ? Vaping Use: Never used  ?Substance and Sexual Activity  ? Alcohol use: No  ? Drug use: No  ? Sexual activity: Yes  ?  Birth control/protection: Abstinence  ?Other Topics Concern  ? Not on file  ?Social History Narrative  ? No caffeine use  ? Right Handed   ? Lives in a two story home   ? ?Social Determinants of Health  ? ?Financial Resource Strain: Not on file  ?Food Insecurity: Not on file  ?Transportation Needs: Not on file  ?Physical Activity: Not on file  ?Stress: Not on file  ?Social Connections: Not on file  ? ? ?Family History  ?Problem Relation Age of Onset  ? Pulmonary embolism Brother   ? Deep vein thrombosis Brother   ? ? ?Past Medical History:  ?Diagnosis Date  ? Pulmonary emboli (HCC)   ? Yeast infection   ? ? ?Past Surgical History:  ?Procedure Laterality Date  ? WISDOM TOOTH EXTRACTION    ? x4  ?  ? ?Current Outpatient Medications on File Prior to Visit  ?Medication Sig Dispense Refill  ? acetaZOLAMIDE ER (DIAMOX) 500 MG capsule TAKE 1 CAPSULE BY MOUTH TWICE A DAY (Patient taking differently: 500 mg. 2 caps in the am, 1 cap in the pm) 180 capsule 0  ? Ascorbic Acid (VITAMIN C) 1000 MG tablet Take 1,000 mg by mouth daily.    ? Cholecalciferol (DIALYVITE VITAMIN D 5000 PO) Take 5,000 Units by mouth daily.    ? potassium chloride 20 MEQ TBCR Take 20 mEq by mouth daily. 90 tablet 1  ? vitamin B-12 (CYANOCOBALAMIN) 1000 MCG tablet Take 1,000 mcg by mouth daily.    ? warfarin (COUMADIN) 3 MG tablet 9mg  on Mon, Wed, Fri, and Sat. 7.5mg  on Tues, Thurs, and Sun. 234 tablet 0  ? ?No current facility-administered medications on file prior to visit.  ? ? ?No Known Allergies ? ?Physical exam: ? ?Today's Vitals  ? 05/17/21 0849  ?BP: 133/90  ?Pulse: 72  ?SpO2: 97%  ?Weight: 230 lb 8 oz (104.6 kg)  ?Height: 5\' 5"  (1.651 m)  ? ?Body mass index is 38.36 kg/m?.  ? ?Wt Readings from Last 3 Encounters:  ?05/17/21 230  lb 8 oz (104.6 kg)  ?02/25/21 228 lb (103.4 kg)  ?02/16/21 228 lb 12.8 oz (103.8 kg)  ?  ? ?Ht Readings from Last 3 Encounters:  ?05/17/21 5\' 5"  (1.651 m)  ?02/25/21 5\' 5"  (1.651 m)  ?02/16/21 5' 5.5" (1.664 m)  ?  ?  ?General: The patient is awake, alert and appears not in  acute distress. The patient is well groomed. ?Head: Normocephalic, atraumatic. Neck is supple. Mallampati  3 plus,  ?neck circumference:14.5  inches . Nasal airflow patent.  Retrognathia is  seen.  ?Dental status: biological   ?Cardiovascular:  Regular rate and cardiac rhythm by pulse,  without distended neck veins. ?Respiratory: Lungs are clear to auscultation.  ?Skin:  With evidence of ankle edema,  ?Trunk: The patient's posture is erect. ?  ?Neurologic exam : ?The patient is awake and alert, oriented to place and time.   ?Memory subjective described as intact.  ?Attention span & concentration ability appears normal.  ?Speech is fluent,  without  dysarthria, dysphonia or aphasia.  ?Mood and affect are appropriate. ?  ?Cranial nerves: no loss of smell or taste reported  ?Pupils are equal and briskly reactive to light. Funduscopic exam deferred. Proptosis, .  ?Extraocular movements in vertical and horizontal planes were intact and without nystagmus. No Diplopia. ?Visual fields by finger perimetry are intact. ?Hearing was intact to soft voice and finger rubbing.   ? Facial sensation intact to fine touch. ? Facial motor strength is symmetric and tongue and uvula move midline.  ?Neck ROM : rotation, tilt and flexion extension were normal for age and shoulder shrug was symmetrical.  ?  ?Motor exam:  Symmetric bulk, tone and ROM.   ?Normal tone without cog- wheeling, symmetric grip strength . ?  ?Sensory:  Fine touch and vibration were normal.  ?Proprioception tested in the upper extremities was normal. ?  ?Coordination: Rapid alternating movements in the fingers/hands were of normal speed.  ?The Finger-to-nose maneuver was intact without evidence of  ataxia, dysmetria or tremor. ?  ?Gait and station: Patient could rise unassisted from a seated position, walked without assistive device.  ?Stance is of normal width/ base.  ?Toe and heel walk were deferred.

## 2021-05-17 NOTE — Patient Instructions (Signed)
Sleep Apnea ?Sleep apnea affects breathing during sleep. It causes breathing to stop for 10 seconds or more, or to become shallow. People with sleep apnea usually snore loudly. ?It can also increase the risk of: ?Heart attack. ?Stroke. ?Being very overweight (obese). ?Diabetes. ?Heart failure. ?Irregular heartbeat. ?High blood pressure. ?The goal of treatment is to help you breathe normally again. ?What are the causes? ?The most common cause of this condition is a collapsed or blocked airway. ?There are three kinds of sleep apnea: ?Obstructive sleep apnea. This is caused by a blocked or collapsed airway. ?Central sleep apnea. This happens when the brain does not send the right signals to the muscles that control breathing. ?Mixed sleep apnea. This is a combination of obstructive and central sleep apnea. ?What increases the risk? ?Being overweight. ?Smoking. ?Having a small airway. ?Being older. ?Being female. ?Drinking alcohol. ?Taking medicines to calm yourself (sedatives or tranquilizers). ?Having family members with the condition. ?Having a tongue or tonsils that are larger than normal. ?What are the signs or symptoms? ?Trouble staying asleep. ?Loud snoring. ?Headaches in the morning. ?Waking up gasping. ?Dry mouth or sore throat in the morning. ?Being sleepy or tired during the day. ?If you are sleepy or tired during the day, you may also: ?Not be able to focus your mind (concentrate). ?Forget things. ?Get angry a lot and have mood swings. ?Feel sad (depressed). ?Have changes in your personality. ?Have less interest in sex, if you are female. ?Be unable to have an erection, if you are female. ?How is this treated? ? ?Sleeping on your side. ?Using a medicine to get rid of mucus in your nose (decongestant). ?Avoiding the use of alcohol, medicines to help you relax, or certain pain medicines (narcotics). ?Losing weight, if needed. ?Changing your diet. ?Quitting smoking. ?Using a machine to open your airway while you  sleep, such as: ?An oral appliance. This is a mouthpiece that shifts your lower jaw forward. ?A CPAP device. This device blows air through a mask when you breathe out (exhale). ?An EPAP device. This has valves that you put in each nostril. ?A BIPAP device. This device blows air through a mask when you breathe in (inhale) and breathe out. ?Having surgery if other treatments do not work. ?Follow these instructions at home: ?Lifestyle ?Make changes that your doctor recommends. ?Eat a healthy diet. ?Lose weight if needed. ?Avoid alcohol, medicines to help you relax, and some pain medicines. ?Do not smoke or use any products that contain nicotine or tobacco. If you need help quitting, ask your doctor. ?General instructions ?Take over-the-counter and prescription medicines only as told by your doctor. ?If you were given a machine to use while you sleep, use it only as told by your doctor. ?If you are having surgery, make sure to tell your doctor you have sleep apnea. You may need to bring your device with you. ?Keep all follow-up visits. ?Contact a doctor if: ?The machine that you were given to use during sleep bothers you or does not seem to be working. ?You do not get better. ?You get worse. ?Get help right away if: ?Your chest hurts. ?You have trouble breathing in enough air. ?You have an uncomfortable feeling in your back, arms, or stomach. ?You have trouble talking. ?One side of your body feels weak. ?A part of your face is hanging down. ?These symptoms may be an emergency. Get help right away. Call your local emergency services (911 in the [place] ?Do not wait to see if the symptoms   will go away. ?Do not drive yourself to the hospital. ?Summary ?This condition affects breathing during sleep. ?The most common cause is a collapsed or blocked airway. ?The goal of treatment is to help you breathe normally while you sleep. ?This information is not intended to replace advice given to you by your health care provider. Make  sure you discuss any questions you have with your health care provider. ?Document Revised: 09/08/2020 Document Reviewed: 01/09/2020 ?Elsevier Patient Education ? 2022 Elsevier Inc. ? ?

## 2021-05-23 ENCOUNTER — Encounter: Payer: Self-pay | Admitting: Nurse Practitioner

## 2021-05-25 ENCOUNTER — Encounter: Payer: Self-pay | Admitting: Nurse Practitioner

## 2021-06-03 NOTE — Progress Notes (Signed)
? ?Assessment/Plan:  ? ?1.  Idiopathic intracranial hypertension (former pseudotumor cerebri) ?            -Very few records from her prior neurologist in [place] were received.  MRI from February, 2021 in [place] and from here and 2023 were stable, both revealing slitlike ventricles and a partially empty sella. ? -LP never done because patient on Coumadin. ? -MRV never done, but if she had a history of central venous thrombosis, patient is already on the treatment for it (Coumadin) as she has a history of PE. ?            -Discussed with the patient that because she cannot have lumbar punctures, we have to follow her through ophthalmologic testing.  She saw ophthalmology at [facility].  Her visual field testing was normal.  Her OCT was normal.  There was just trace blurring of the disc margin bilaterally.  They had nothing for comparison.  My guess is that this was probably residual.   ? -Because she is doing well through all the parameters we can currently follow, my inclination is to go ahead and decrease the Diamox to 1 capsule twice per day.  She is nervous about that.  She does tell me that she apparently just had repeat testing done locally at Dr. Hosie Poisson office.  I did not get any of that testing.  I will try to get a copy of that.  Again, if it is stable, then I would like to go ahead and try to back off on the Diamox, even if it is just 1 tablet twice per day.  She was agreeable to that approach. ? -discussed value of weight loss even if modest amt ? -We will do her labs today.  Will include acetylcholine receptor antibodies, as Durenda Age had ordered this last visit and apparently they were not done by the lab. ?  ?2.  Hx of R hemifacial spasm by history ?-I have wondered if, in fact, she does not have an old Bell's palsy on the right.  It certainly looks that way, with subsequent synkinesis and aberrant reinnervation resulting in the spasm that she describes.  She stated that she had "bad spasms"  when it first started but notes she only has this twitching around the eye.  She is not bothered by that, but she is bothered by facial asymmetry.  She was hoping that would get better, but I told her that was very unlikely.  She has had contrasted MRI brain very recently that was normal.  As previous, we have never been able to get records from her prior neurologist in [place] and this is when it all started, several years ago. ?            -none seen today but suspect that the asymmetry she notes is from this.  However, pt states that has not had spasm in the [facility] for 2 years (but has noted "twitch" in the R eye occasionally) ? -MRI brain has been negative. ? -Patient has declined Botox, although this likely would just make facial asymmetry worse.  In addition, she is on Coumadin, so Botox is a bit risky. ?  ?3.  Hx of multiple PE's ?            -on lifelong coumadin ? ?4.  F/u 6 months ? ? ?Subjective:  ? ?Theresa Riley was seen today in follow up for IIH.  Multiple records reviewed since last visit.  She had  an MRI of the brain.  Personally reviewed.  It was essentially unremarkable.  She [facility].  She was seen there February 3.  Those notes are reviewed.  Her OCT was normal bilaterally.  Her visual field testing was normal bilaterally.  Noted that there was very trace blurring of the disc margin bilaterally on examination.  She saw [facility] for her complaints of right hemifacial spasm.   [facility] notes indicate same thing that we had noted (facial asymmetry).  Patient has never wanted to pursue Botox, but this also would make facial asymmetry worse.  she states that she saw her local eye doctor in April and had repeat testing done on her eyes.  They were supposed to send a copy here but did not.  She knows that it was not worse.  She thinks that this testing was the same as previous.  She is frustrated by the asymmetry of her  smile.  She mentions that primary care is working her up for sleep apnea.  They think that is the reason for nocturia. ? ?Diamox: 500 mg 2 in the AM, 1 in the PM ? ?CURRENT MEDICATIONS:  ?Outpatient Encounter Medications as of 06/08/2021  ?Medication Sig  ? acetaZOLAMIDE ER (DIAMOX) 500 MG capsule TAKE 1 CAPSULE BY MOUTH TWICE A DAY (Patient taking differently: 500 mg. 2 caps in the am, 1 cap in the pm)  ? Ascorbic Acid (VITAMIN C) 1000 MG tablet Take 1,000 mg by mouth daily.  ? Cholecalciferol (DIALYVITE VITAMIN D 5000 PO) Take 5,000 Units by mouth daily.  ? potassium chloride 20 MEQ TBCR Take 20 mEq by mouth daily.  ? vitamin B-12 (CYANOCOBALAMIN) 1000 MCG tablet Take 1,000 mcg by mouth daily.  ? warfarin (COUMADIN) 3 MG tablet 9mg  on Mon, Wed, Fri, and Sat. 7.5mg  on Tues, Thurs, and Sun.  ? ?No facility-administered encounter medications on file as of 06/08/2021.  ? ? ? ?Objective:  ? ?PHYSICAL EXAMINATION:   ? ?VITALS:   ?Vitals:  ? 06/08/21 0914  ?BP: 131/82  ?Pulse: 71  ?SpO2: 96%  ?Weight: 232 lb (105.2 kg)  ?Height: 5\' 5"  (1.651 m)  ? ? ? ? ? ?GEN:  Normal appears female in no acute distress.  Appears stated age. ?HEENT:  Normocephalic, atraumatic. The mucous membranes are moist. The superficial temporal arteries are without ropiness or tenderness. ?Cardiovascular: Regular rate and rhythm. ?Lungs: Clear to auscultation bilaterally. ?Neck/Heme: There are no carotid bruits noted bilaterally. ?  ?NEUROLOGICAL: ?Orientation:  The patient is alert and oriented x 3.   ?Cranial nerves: NL folds equal at rest, but she does have decreased nasolabial fold on the right with smile.  She has asymmetric blink.  She has decreased forehead wrinkle on the right.  She has a single fasciculation at the right orbicularis oculi, medial, lower lid. she has mild ptosis on the L. Fundoscopic attempted but disc margins look good on the L, not well visualized on R.  Extraocular muscles are intact and visual fields are full to  confrontational testing. Speech is fluent and clear. Soft palate rises symmetrically and there is no tongue deviation. Hearing is intact to conversational tone. ?Tone: Tone is good throughout. ?Sensation: Sensation is intact to light touch x 4 ?Coordination:  The patient has no difficulty with RAM's or FNF bilaterally. ?Motor: Strength is 5/5 in the bilateral upper and lower extremities.  Shoulder shrug is equal and symmetric. There is no pronator drift.  There are no fasciculations noted with the exception of a single fasciculation at the right orbicularis oris oculi, described above. ? ?I have reviewed and interpreted the following labs independently ?  Chemistry   ?   ?Component Value Date/Time  ? NA 139 02/16/2021 1025  ? K 3.4 (L) 02/16/2021 1025  ? CL 111 02/16/2021 1025  ? CO2 22 02/16/2021 1025  ? BUN 13 02/16/2021 1025  ? CREATININE 0.81 02/16/2021 1025  ?    ?Component Value Date/Time  ? CALCIUM 9.0 02/16/2021 1025  ? ALKPHOS 88 02/16/2021 1025  ? AST 20 02/16/2021 1025  ? ALT 23 02/16/2021 1025  ? BILITOT 0.4 02/16/2021 1025  ?  ? ? ? ? ?Total time spent on today's visit was 40 minutes, including both face-to-face time and nonface-to-face time.  Time included that spent on review of records (prior notes available to me/labs/imaging if pertinent), discussing treatment and goals, answering patient's questions and coordinating care. ? ?Cc:  Nche, Bonna Gains, NP ? ?

## 2021-06-06 ENCOUNTER — Other Ambulatory Visit: Payer: Self-pay | Admitting: Neurology

## 2021-06-06 ENCOUNTER — Telehealth: Payer: Self-pay

## 2021-06-06 DIAGNOSIS — G4733 Obstructive sleep apnea (adult) (pediatric): Secondary | ICD-10-CM

## 2021-06-06 NOTE — Telephone Encounter (Signed)
LVM for pt to call me back to schedule sleep study  

## 2021-06-06 NOTE — Telephone Encounter (Signed)
HST ordered for the pt 

## 2021-06-06 NOTE — Telephone Encounter (Signed)
Theresa Riley has denied CPAP sleep study request. Theresa Riley states that pt has to try autoPAP first and fail it before it will approve a in lab cpap study.  If MD wants to have a sleep study on file before ordering autopap, I would recommend a HST first. Would MD like to proceed with HST? If so, please place order for HST. Thanks.  ?

## 2021-06-08 ENCOUNTER — Ambulatory Visit: Payer: Managed Care, Other (non HMO) | Admitting: Neurology

## 2021-06-08 ENCOUNTER — Encounter: Payer: Self-pay | Admitting: Neurology

## 2021-06-08 ENCOUNTER — Other Ambulatory Visit: Payer: Managed Care, Other (non HMO)

## 2021-06-08 ENCOUNTER — Telehealth: Payer: Self-pay

## 2021-06-08 VITALS — BP 131/82 | HR 71 | Ht 65.0 in | Wt 232.0 lb

## 2021-06-08 DIAGNOSIS — H02402 Unspecified ptosis of left eyelid: Secondary | ICD-10-CM

## 2021-06-08 DIAGNOSIS — G932 Benign intracranial hypertension: Secondary | ICD-10-CM | POA: Diagnosis not present

## 2021-06-08 DIAGNOSIS — Q670 Congenital facial asymmetry: Secondary | ICD-10-CM | POA: Diagnosis not present

## 2021-06-08 NOTE — Telephone Encounter (Signed)
Called Dr. Hosie Poisson office and requested notes from patients last appointment  ?

## 2021-06-08 NOTE — Patient Instructions (Signed)
Your provider has requested that you have labwork completed today. The lab is located on the Second floor at [address], within the [facility] office. When you get off the elevator, turn right and go in the [facility] [address]; the first brown door on the left.  Tell the ladies behind the desk that you are there for lab work. If you are not called within 15 minutes please check with the front desk.   Once you complete your labs you are free to go. You will receive a call or message via MyChart with your lab results.    

## 2021-06-13 ENCOUNTER — Ambulatory Visit (INDEPENDENT_AMBULATORY_CARE_PROVIDER_SITE_OTHER): Payer: Managed Care, Other (non HMO) | Admitting: Neurology

## 2021-06-13 DIAGNOSIS — G4733 Obstructive sleep apnea (adult) (pediatric): Secondary | ICD-10-CM | POA: Diagnosis not present

## 2021-06-13 DIAGNOSIS — I2782 Chronic pulmonary embolism: Secondary | ICD-10-CM

## 2021-06-15 NOTE — Progress Notes (Signed)
? ?  ?  ?[facility] ?  ?HOME SLEEP TEST REPORT ( by Watch PAT)   ?STUDY DATE:  06-15-2021 ?  ?ORDERING CLINICIAN: Carmen Dohmeier, MD  ?REFERRING CLINICIAN: Nche Charlotte Lum, NP ?Primary Neurologist : Dr Tat ?  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 Internal referral for OSA. Pt just moved from [place] in 2021. Had previously sleep study around 2021 and was dx with severe sleep apnea at that time. Was not able to complete titration study d/t insurance issues, never set up with CPAP/ PSG from [place] , [place]:06-2020;  ?The patient was seen at the [facility] for sleep disorders, referring physician was Dr. John Johnson.  She was diagnosed with an overall AHI of 20.9/h and REM AHI of 42.1/h/ non-REM sleep AHI was 14.8/h.   with obstructive hypopneas.  Heart rate was in normal variability, she slept supine only.  Her hypnogram shows 3 distinct REM sleep cycles, during REM sleep she had 14 desaturations / hour of REM sleep.  02 Nadir was 74%. ?  ?Epworth sleepiness score: 13/24. ?  ?BMI:38 kg/m? ?  ?Neck Circumference: 15" ?  ?FINDINGS: ?  ?Sleep Summary: ?  ?Total Recording Time (hours, min): This home sleep test total recording time was 8 hours 57 minutes of which 7 hours and 36 minutes was a total recorded sleep time with 25.1% REM sleep.                        ?  ?Respiratory Indices: ?  ?Calculated pAHI (per hour):   32/h.  Interestingly REM pAHI: Was 20.2/h while NREM pAHI:   was 35.9/h.                                                                   ?  ?Positional AHI: The patient slept mainly in supine position where she reached an AHI of 50.7/h followed by right-sided sleep with an AHI of 0.9/h and left-sided sleep with an AHI of 23.9/h. ? ?Snoring level reached a mean volume of 44 dB and  accompanied 66% of the total sleep time.  This is moderate severe snoring.                                                 ?  ?Oxygen Saturation Statistics: ?  ?O2 Saturation Range (%): Between a nadir at 77% and a maximum saturation at 100% with a mean saturation of 94%.                                   ?  ?O2 Saturation (minutes) <89%: 12.9 minutes       ?  ?Pulse Rate Statistics:             ?  ?Pulse Range: Between 46 and 105 bpm with a mean heart rate of   72 bpm.  Please note that this home sleep test can only give heart rate not rhythm data.             ?  ?IMPRESSION:  This HST confirms the presence of severe sleep apnea with an unusual distribution of non-REM sleep apnea being more frequent than REM sleep apnea.  This home sleep test did not indicate the presence of central sleep apnea and the majority of the patient's apnea was associated with a 4% drop in oxygen saturation.  This in combination with loud snoring and mild overall hypoxemia makes positive airway pressure therapy the preferred way of treatment.  Dental devices or an inspire implant a less likely to eliminate apnea and cannot help to sustain oxygen saturation. ?  ?RECOMMENDATION: ?An autotitration CPAP device by [employer] is preferred with a setting between 6 and 16 cm water pressure, 3 cm EPR, heated humidification and a mask or interface of patient's choice and comfort.  As usual, I would like the patient to be fitted in a reclined position.  This patient has retrognathia, mild degree. If the patient voices concerns about claustrophobia please allow for a nasal interface as a starter set up if necessary in conjunction with a chinstrap. ? ?  ?INTERPRETING PHYSICIAN: ? ? Carmen Dohmeier, MD  ? ?Medical Director of [facility].  ? ? ? ? ? ? ? ? ? ? ? ? ? ? ? ? ? ? ? ? ?

## 2021-06-16 LAB — MYASTHENIA GRAVIS PANEL 2
A CHR BINDING ABS: <0.3 nmol/L
ACHR Blocking Abs: <15 % Inhibition (ref <15)
Acetylchol Modul Ab: 2 % Inhibition

## 2021-06-21 ENCOUNTER — Telehealth: Payer: Self-pay | Admitting: *Deleted

## 2021-06-21 DIAGNOSIS — I2782 Chronic pulmonary embolism: Secondary | ICD-10-CM | POA: Insufficient documentation

## 2021-06-21 NOTE — Addendum Note (Signed)
Addended by: Melvyn Novas on: 06/21/2021 08:43 AM ? ? Modules accepted: Orders ? ?

## 2021-06-21 NOTE — Procedures (Signed)
? ?  ?  ?[facility] ?  ?HOME SLEEP TEST REPORT ( by Watch PAT)   ?STUDY DATE:  06-15-2021 ?  ?ORDERING CLINICIAN: Larey Seat, MD  ?REFERRING CLINICIAN: Nche Charlene Brooke, NP ?Primary Neurologist : Dr Tat ?  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 Internal referral for OSA. Pt just moved from [facility] in 2021. Had previously sleep study around 2021 and was dx with severe sleep apnea at that time. Was not able to complete titration study d/t insurance issues, never set up with CPAP/ PSG from [facility] [place];  ?The patient was seen at the [facility] for sleep disorders, referring physician was Dr. Meade Maw.  She was diagnosed with an overall AHI of 20.9/h and REM AHI of 42.1/h/ non-REM sleep AHI was 14.8/h.   with obstructive hypopneas.  Heart rate was in normal variability, she slept supine only.  Her hypnogram shows 3 distinct REM sleep cycles, during REM sleep she had 14 desaturations / hour of REM sleep.  02 Nadir was 74%. ?  ?Epworth sleepiness score: 13/24. ?  ?BMI:38 kg/m? ?  ?Neck Circumference: 15" ?  ?FINDINGS: ?  ?Sleep Summary: ?  ?Total Recording Time (hours, min): This home sleep test total recording time was 8 hours 57 minutes of which 7 hours and 36 minutes was a total recorded sleep time with 25.1% REM sleep.                        ?  ?Respiratory Indices: ?  ?Calculated pAHI (per hour):   32/h.  Interestingly REM pAHI: Was 20.2/h while NREM pAHI:   was 35.9/h.                                                                   ?  ?Positional AHI: The patient slept mainly in supine position where she reached an AHI of 50.7/h followed by right-sided sleep with an AHI of 0.9/h and left-sided sleep with an AHI of 23.9/h. ? ?Snoring level reached a mean volume of 44 dB and  accompanied 66% of the total sleep time.  This is moderate severe snoring.                                                 ?  ?Oxygen Saturation Statistics: ?  ?O2 Saturation Range (%): Between a nadir at 77% and a maximum saturation at 100% with a mean saturation of 94%.                                   ?  ?O2 Saturation (minutes) <89%: 12.9 minutes       ?  ?Pulse Rate Statistics:             ?  ?Pulse Range: Between 46 and 105 bpm with a mean heart rate of  72 bpm.  Please note that this home sleep test can only give heart rate not rhythm data.             ?  ?IMPRESSION:  This HST confirms the presence of severe sleep apnea with an unusual distribution of non-REM sleep apnea being more frequent than REM sleep apnea.  This home sleep test did not indicate the presence of central sleep apnea and the majority of the patient's apnea was associated with a 4% drop in oxygen saturation.  This in combination with loud snoring and mild overall hypoxemia makes positive airway pressure therapy the preferred way of treatment.  Dental devices or an inspire implant a less likely to eliminate apnea and cannot help to sustain oxygen saturation. ?  ?RECOMMENDATION: ?An autotitration CPAP device by [employer] is preferred with a setting between 6 and 16 cm water pressure, 3 cm EPR, heated humidification and a mask or interface of patient's choice and comfort.  As usual, I would like the patient to be fitted in a reclined position.  This patient has retrognathia, mild degree. If the patient voices concerns about claustrophobia please allow for a nasal interface as a starter set up if necessary in conjunction with a chinstrap. ? ?  ?INTERPRETING PHYSICIAN: ? ? Larey Seat, MD  ? ?Medical Director of [facility].  ? ? ? ? ? ? ? ? ? ? ? ? ? ? ? ? ? ? ? ? ?

## 2021-06-21 NOTE — Telephone Encounter (Signed)
-----   Message from Melvyn Novas, MD sent at 06/21/2021  8:43 AM EDT ----- ?IMPRESSION:  This HST confirms the presence of severe sleep apnea with an unusual distribution of non-REM sleep apnea being more frequent than REM sleep apnea.  This home sleep test did not indicate the presence of central sleep apnea and the majority of the patient's apnea was associated with a 4% drop in oxygen saturation.  This in combination with loud snoring and mild overall hypoxemia makes positive airway pressure therapy the preferred way of treatment.  Dental devices or an inspire implant a less likely to eliminate apnea and cannot help to sustain oxygen saturation. ?? ?RECOMMENDATION: ?An autotitration CPAP device by ResMed is preferred with a setting between 6 and 16 cm water pressure, 3 cm EPR, heated humidification and a mask or interface of patient's choice and comfort.  As usual, I would like the patient to be fitted in a reclined position.  This patient has retrognathia, mild degree. If the patient voices concerns about claustrophobia please allow for a nasal interface as a starter set up if necessary in conjunction with a chinstrap. ?? ?? ?INTERPRETING PHYSICIAN: ?? ? Melvyn Novas, MD  ?

## 2021-06-21 NOTE — Telephone Encounter (Signed)
LVM for pt to call about results. °

## 2021-06-23 ENCOUNTER — Other Ambulatory Visit: Payer: Self-pay | Admitting: Physician Assistant

## 2021-06-23 DIAGNOSIS — G932 Benign intracranial hypertension: Secondary | ICD-10-CM

## 2021-06-23 NOTE — Telephone Encounter (Signed)
Pt returned call. I advised pt that Dr. Vickey Huger reviewed their sleep study results and found that pt has sleep apnea. Dr. Vickey Huger recommends that pt starts auto CPAP. I reviewed PAP compliance expectations with the pt. Pt is agreeable to starting a CPAP. I advised pt that an order will be sent to a DME, .[facility], and [facility] will call the pt within about one week after they file with the pt's insurance. [facility] will show the pt how to use the machine, fit for masks, and troubleshoot the CPAP if needed. A follow up appt was made for insurance purposes with Dr. Vickey Huger on 08/30/2021 at 8:30 am. Pt verbalized understanding to arrive 15 minutes early and bring their CPAP. A letter with all of this information in it will be mailed to the pt as a reminder. I verified with the pt that the address we have on file is correct. Pt verbalized understanding of results. Pt had no questions at this time but was encouraged to call back if questions arise. I have sent the order to [facility] and have received confirmation that they have received the order. ? ?

## 2021-07-13 LAB — POCT INR: INR: 2.6 — AB (ref <=1.20)

## 2021-07-15 ENCOUNTER — Encounter: Payer: Self-pay | Admitting: Nurse Practitioner

## 2021-07-15 DIAGNOSIS — Z86711 Personal history of pulmonary embolism: Secondary | ICD-10-CM

## 2021-07-15 DIAGNOSIS — I87009 Postthrombotic syndrome without complications of unspecified extremity: Secondary | ICD-10-CM

## 2021-07-19 MED ORDER — WARFARIN SODIUM 3 MG PO TABS: ORAL_TABLET | ORAL | 0 refills | Status: DC

## 2021-08-01 ENCOUNTER — Encounter: Payer: Managed Care, Other (non HMO) | Admitting: Neurology

## 2021-08-03 ENCOUNTER — Telehealth: Payer: Self-pay | Admitting: Neurology

## 2021-08-03 NOTE — Telephone Encounter (Signed)
Note from Dr. Theone Murdoch in media.  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  He is weaning off of the diamox and will see her back.

## 2021-08-04 ENCOUNTER — Encounter: Payer: Self-pay | Admitting: Neurology

## 2021-08-05 ENCOUNTER — Telehealth: Payer: Self-pay

## 2021-08-05 DIAGNOSIS — M542 Cervicalgia: Secondary | ICD-10-CM

## 2021-08-07 LAB — POCT INR
INR: 2.5 — AB (ref 0.80–1.20)
INR: 2.5 — AB (ref <=1.20)

## 2021-08-08 ENCOUNTER — Other Ambulatory Visit: Payer: Self-pay | Admitting: Nurse Practitioner

## 2021-08-08 DIAGNOSIS — Z86711 Personal history of pulmonary embolism: Secondary | ICD-10-CM

## 2021-08-08 DIAGNOSIS — I87009 Postthrombotic syndrome without complications of unspecified extremity: Secondary | ICD-10-CM

## 2021-08-09 MED ORDER — WARFARIN SODIUM 3 MG PO TABS: ORAL_TABLET | ORAL | 0 refills | Status: DC

## 2021-08-10 NOTE — Progress Notes (Signed)
Order(s) created erroneously. Erroneous order ID: [id number]  Order canceled by: Berenice Bouton  Order cancel date/time: 08/10/2021 4:37 PM

## 2021-08-15 ENCOUNTER — Inpatient Hospital Stay
Admission: RE | Admit: 2021-08-15 | Discharge: 2021-08-15 | Disposition: A | Discharge status: Home / Self Care | Payer: Managed Care, Other (non HMO) | Source: Ambulatory Visit | Attending: Neurology | Admitting: Neurology

## 2021-08-29 LAB — POCT INR: INR: 3.1 — AB (ref <=1.20)

## 2021-08-30 ENCOUNTER — Encounter: Payer: Self-pay | Admitting: Neurology

## 2021-08-30 ENCOUNTER — Ambulatory Visit (INDEPENDENT_AMBULATORY_CARE_PROVIDER_SITE_OTHER): Payer: Managed Care, Other (non HMO) | Admitting: Neurology

## 2021-08-30 VITALS — BP 126/80 | HR 73 | Ht 65.0 in | Wt 232.5 lb

## 2021-08-30 DIAGNOSIS — Z86711 Personal history of pulmonary embolism: Secondary | ICD-10-CM | POA: Diagnosis not present

## 2021-08-30 DIAGNOSIS — G4733 Obstructive sleep apnea (adult) (pediatric): Secondary | ICD-10-CM

## 2021-08-30 DIAGNOSIS — Z9989 Dependence on other enabling machines and devices: Secondary | ICD-10-CM

## 2021-08-30 DIAGNOSIS — Q670 Congenital facial asymmetry: Secondary | ICD-10-CM

## 2021-08-30 NOTE — Progress Notes (Signed)
SLEEP MEDICINE CLINIC    Provider:  Larey Seat, MD  Primary Care Physician:  Flossie Buffy, NP [place] [address]     Referring Provider: Flossie Buffy, [address] [place],  [place] [address]    Primary neurologist : Alonza Bogus, DO          Chief Complaint according to patient   Patient presents with:     New Patient (Initial Visit)           HISTORY OF PRESENT ILLNESS:  Theresa Riley is a 46 y.o.  African American female patient seen here in a RV on 08/30/2021, The patient underwent a home sleep test on 5-3 2023 based on a history of blood clotting disorder pulmonary emboli and suspicion of OSA.  She had a sleep study in 2021 in [place] diagnosed in [place] with severe sleep apnea was diagnosed with a REM AHI of 42.1.  Epworth sleepiness score was endorsed at 13 points.   06-15-2021: The watch pat home sleep test confirmed an AHI of 32/h interestingly her non-REM sleep apnea here was higher than her REM sleep apnea.   There was also a significant positional dependency.  The patient slept mainly in supine position where she reached an AHI apnea hypopnea index of 50.7/h when she slept on her right side her AHI was 0.9/h.  She had 30 minutes of desaturation with a nadir of 77%.  And I ordered an auto titrating CPAP between 6 and 16 cm pressure window 3 cm expiratory pressure relief at the interface of patient's choice.  The patient presents with her new machine and is highly compliant excellent compliance of 100% by days and hours average user time 7 hours 18 minutes.  Residual AHI is 1.0/h and the 95th percentile pressure is 15.5 cm.  Minimal air leakage which means that the interface is fitting her very well.   She feels refreshed in AM, and her Epworth score is now 5 / 24 points - down from 13/ 24 points.  Nocturia 4-5 times is now reduced to one time at night.     Original referral from NP Nche for a Sleep  Consultation, MD.  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  She  has a past medical history of Pulmonary emboli (2014- 2017 / on warfarin)HCC) and Yeast infection.    The patient had the first sleep study in the year 2021. Internal referral for OSA. Pt just moved from [facility] in 2021. Had previously sleep study around 2021.  Dx with severe sleep apnea at that time. Was not able to complete titration study d/t insurance issues, never set up with CPAP.   Outside PSG from [place] , [place]: 06-2020;  The patient was seen at the [facility] for sleep disorders, referring physician was Dr. Meade Maw.  She was diagnosed with an overall AHI of 20.9 and REM AHI of 42.1/h non-REM sleep AHI was 14.8/h.  There were no significant periodic limb movements or snoring arousals.  She had 1 central apnea all other events were obstructive but the vast majority was hypopnea.  Heart rate was in normal variability, she slept supine only.  Her hypnogram shows 3 distinct REM sleep cycles, in rem sleep she had 14 desaturations per hour of REM sleep.  Nadir was 74%.   Sleep relevant medical history: Nocturia 3-5 times at night- fragmented sleep. "Every time I roll over" .has enuresis.  No TBI no ENT surgery. Wisdom teeth 4 removed.    Family medical /sleep history: mother is likely undiagnosed,  no other family member on CPAP with OSA.  Social history:  Patient is working as Recruitment consultant,  [employer] graduate, and lives in a household with  mother.  Family status is single.  The patient currently works full time, part from home, Tobacco use;none .  ETOH use ; rare ,  Caffeine intake ; none      Sleep habits are as follows: The patient's dinner time is between 8-9 PM. The patient goes to bed at 11-12 PM and continues to sleep for intervals 1-2 hours, wakes for many,  many bathroom breaks.   The preferred sleep position is supine but she wakes up snoring, choking, , with the support of 1-pillow. Dreams are reportedly frequent/vivid.  7.30 AM is the usual rise time. The patient wakes up spontaneously with an alarm at 6.00.  She reports sometimes  not feeling refreshed or restored in AM. Naps are taken frequently, dozing off in meetings.    Review of Systems: Out of a complete 14 system review, the patient complains of only the following symptoms, and all other reviewed systems are negative.:   Therapeutic success on CPAP Increase pressure window to 7-18 cm water. Keep EPR of 3 cm water.  Mask is working well , patient is highly compliant and shows good success in sleepiness and nocturia reduction.    How likely are you to doze in the following situations: 0 = not likely, 1 = slight chance, 2 = moderate chance, 3 = high chance   Sitting and Reading? Watching Television? Sitting inactive in a public place (theater or meeting)? As a passenger in a car for an hour without a break? Lying down in the afternoon when circumstances permit? Sitting and talking to someone? Sitting quietly after lunch without alcohol? In a car, while stopped for a few minutes in traffic?   Total = 5 from 13 / 24 points   FSS endorsed at 18 from 21/ 63 points.   Social History   Socioeconomic History   Marital status: Single    Spouse name: Not on file   Number of children: 2   Years of education: Not on file   Highest education level: Not on file  Occupational History   Not on file  Tobacco Use   Smoking status: Never   Smokeless tobacco: Never  Vaping Use   Vaping Use: Never used  Substance and Sexual Activity   Alcohol use: No   Drug use: No   Sexual activity: Yes    Birth control/protection: Abstinence  Other Topics Concern   Not on file  Social History Narrative   No caffeine use   Right Handed    Lives in a two story home    Social Determinants of  Health   Financial Resource Strain: Not on file  Food Insecurity: Not on file  Transportation Needs: Not on file  Physical Activity: Not on file  Stress: Not on file  Social Connections: Not on file    Family History  Problem Relation Age of Onset   Pulmonary embolism Brother    Deep vein thrombosis Brother     Past Medical History:  Diagnosis Date   Pulmonary emboli ([place])    Yeast  infection     Past Surgical History:  Procedure Laterality Date   WISDOM TOOTH EXTRACTION     x4     Current Outpatient Medications on File Prior to Visit  Medication Sig Dispense Refill   acetaZOLAMIDE ER (DIAMOX) 500 MG capsule TAKE 2 CAPSULES IN THE MORNING AND 1 CAPSULE IN THE EVENING 270 capsule 1   Ascorbic Acid (VITAMIN C) 1000 MG tablet Take 1,000 mg by mouth daily.     Cholecalciferol (DIALYVITE VITAMIN D 5000 PO) Take 5,000 Units by mouth daily.     potassium chloride 20 MEQ TBCR Take 20 mEq by mouth daily. 90 tablet 1   vitamin B-12 (CYANOCOBALAMIN) 1000 MCG tablet Take 1,000 mcg by mouth daily.     warfarin (COUMADIN) 3 MG tablet 9mg  on Mon, Wed, Fri, and Sat. 7.5mg  on Tues, Thurs, and Sun. 234 tablet 0   No current facility-administered medications on file prior to visit.    No Known Allergies  Physical exam:  Today's Vitals   08/30/21 1139  BP: 126/80  Pulse: 73  Weight: 232 lb 8 oz (105.5 kg)  Height: 5\' 5"  (1.651 m)   Body mass index is 38.69 kg/m.   Wt Readings from Last 3 Encounters:  08/30/21 232 lb 8 oz (105.5 kg)  06/08/21 232 lb (105.2 kg)  05/17/21 230 lb 8 oz (104.6 kg)     Ht Readings from Last 3 Encounters:  08/30/21 5\' 5"  (1.651 m)  06/08/21 5\' 5"  (1.651 m)  05/17/21 5\' 5"  (1.651 m)      General: The patient is awake, alert and appears not in acute distress. The patient is well groomed. Head: Normocephalic, atraumatic. Neck is supple. Mallampati  3 plus,  neck circumference:14.5  inches . Nasal airflow patent.  Retrognathia is  seen.  Dental  status: biological   Cardiovascular:  Regular rate and cardiac rhythm by pulse,  without distended neck veins. Respiratory: Lungs are clear to auscultation.  Skin:  With evidence of ankle edema,  Trunk: The patient's posture is erect.   Neurologic exam : The patient is awake and alert, oriented to place and time.   Memory subjective described as intact.  Attention span & concentration ability appears normal.  Speech is fluent,  without  dysarthria, dysphonia or aphasia.  Mood and affect are appropriate.   Cranial nerves: no loss of smell or taste reported  Pupils are equal and briskly reactive to light. Funduscopic exam deferred. Proptosis, .  Extraocular movements in vertical and horizontal planes were intact and without nystagmus. No Diplopia. Visual fields by finger perimetry are intact. Hearing was intact to soft voice and finger rubbing.    Facial sensation intact to fine touch.  Facial motor strength is symmetric and tongue and uvula move midline.  Neck ROM : rotation, tilt and flexion extension were normal for age and shoulder shrug was symmetrical.    Motor exam:  Symmetric bulk, tone and ROM.   Normal tone without cog- wheeling, symmetric grip strength .   Sensory:  Fine touch and vibration were normal.  Proprioception tested in the upper extremities was normal.   Coordination: Rapid alternating movements in the fingers/hands were of normal speed.  The Finger-to-nose maneuver was intact without evidence of ataxia, dysmetria or tremor.   Gait and station: Patient could rise unassisted from a seated position, walked without assistive device.  Stance is of normal width/ base.  Toe and heel walk were deferred.  Deep tendon reflexes: in the upper and  lower extremities are symmetric and intact.  Babinski response was deferred.      After spending a total time of  15  minutes face to face and additional time for physical and neurologic examination, review of laboratory  studies,  personal review of imaging studies, reports and results of other testing and review of referral information / records as far as provided in visit, I have established the following assessments:  1)  OSA confirmed by HST, moderate-severe- the patient presents with her new auto CPAP machine and is highly compliant excellent compliance of 100% by days and hours average user time 7 hours 18 minutes.  Residual AHI is 1.0/h (!) and the 95th percentile pressure is 15.5 cm.  Minimal air leakage which means that the interface is fitting her very well.  She feels refreshed in AM, and her Epworth score is now 5 / 24 points - down from 13/ 24 points.  Nocturia 4-5 times is now reduced to one time at night.    2) She has a history of pulmonary emboli 2 times without known DVT preceding those.  She remains anticoagulated on warfarin.    My Plan is to proceed with:  1) I will increase the pressure window by 2 cm water , Increase pressure window to 7-18 cm water. Keep EPR of 3 cm water.  Mask is working well , patient is highly compliant and shows good success in sleepiness and nocturia reduction.   I would like to thank Nche, Bonna Gains, NP, [address] [place],  [place] [address] for allowing me to meet with and to take care of this pleasant patient.   Follow up through our NP within 12 months.   CC: I will share my notes with PCP and Dr Tat.  Electronically signed by: Melvyn Novas, MD 08/30/2021 11:51 AM  [facility] Board certified by The [employer] of Sleep Medicine and Diplomate of the [employer] of Sleep Medicine. Board certified In Neurology through the ABPN, Fellow of the [employer] of Neurology. Medical Director of [facility].

## 2021-08-30 NOTE — Patient Instructions (Signed)
Increase pressure window to 7-18 cm water. Keep EPR of 3 cm water.  Mask is working well , patient is highly compliant and shows good success in sleepiness and nocturia reduction.   KEEPING IT CLEAN: CPAP HYGIENE PROPER UPKEEP OF YOUR CPAP MACHINE CAN HELP ENSURE THE DEVICE FUNCTIONS PROPERLY CPAP CLEANING INSTRUCTIONS Along with proper CPAP cleaning it is recommended that you replace your mask, tubing and filters once very 3 months and more frequently if you are sick.   DAILY CLEANING Do not use moisturizing soaps, bleach, scented oils, chlorine, or alcohol-based solutions to clean your supplies. These solutions may cause irritation to your skin and lungs and may reduce the life of your products. Dawn [employer] or Comparable works best for daily cleaning.  **If you've been sick, it's smart to wash your mask, tubing, humidifier and filter daily until your cold, flu or virus symptoms are gone. That can help reduce the amount of time you spend under the weather.  Before using your mask -wash your face daily with soap and water to remove excess facial oils. Wipe down your mask (including areas that come in contact with your skin) using a damp towel with soap and warm water. This will remove any oils, dead skin cells, and sweat on the mask that can affect the quality of the seal. Gently rinse with a clean towel and let the mask air-dry out of direct sunlight. You can also use unscented baby wipes or pre-moistened towels designed specifically for cleaning CPAP masks, which are available on-line. DO NOT USE CLOROX OR DISINFECTING WIPES. If your unit has a humidifier, empty any leftover water instead of letting in sit in the unit all day. Refill the humidifier with clean, distilled water right before bedtime for optimal use WEEKLY (OR MORE FREQUENT) CLEANING Your mask and tubing need a full bath at least once a week to keep it free of dust, bacteria, and germs. (During COVID-19 or any other flu/virus we  recommend more frequent cleaning) Clean the CPAP tubing, nasal mask, and headgear in a bathroom sink filled with warm water and a few drops of ammonia-free, mild dish detergent. Avoid using stronger cleaning products, as they may damage the mask or leave harmful residue. Swirl all parts around for about five minutes, rinse well and let air dry during the day. Hang the tubing over the shower rod, on a towel rack or in the laundry room to ensure all the water drips out. The mask and headgear can be air-dried on a towel or hung on a hook or hanger. You should also wipe down your CPAP machine with a damp cloth. Ensure the unit is unplugged. The towel shouldn't be too damp or wet, as water could get into the machine. Clean the filter by removing it and rinsing it in warm tap water. Run it under the water and squeeze to make sure there is no dust. Then blot down the filter with a towel. Do not wash your machine's white filter, if one is present--those are disposable and should be replaced every two weeks. If you are recovering from being sick, we recommend changing the filter sooner. If your CPAP has a humidifier, that also needs to be cleaned weekly. Empty any remaining water and then wash the water chamber in the sink with warm soapy water. Rinse well and drain out as much of the water as possible. Let the chamber air-dry before placing it back into the CPAP unit. Every other week you should disinfect  the humidifier. Do that by soaking it in a solution of one-part vinegar to five parts water for 30 minutes, thoroughly rinsing and then placing in your dishwasher's top rack for washing. And keep it clean by using only distilled water to prevent mineral deposits that can build up and cause damage to your machine. IMPORTANT TIPS Make caring for your CPAP equipment part of your morning routine. Keep machine and accessories out of direct sunlight to avoid damaging them. Never use bleach to clean accessories. Place  machine on a level surface and away from curtains that may interfere with the air intake. Keep track of when you should order replacement parts for your mask and accessories so that you always get the most out of your CPAP. You can also sign up for Auto Supply by contacting our DME department at [email or web address] **The following are examples of soap that may be used: [employer], [place] soap (plain).  With a little upkeep, your CPAP can continue to help you breathe better for a long time. Just a few minutes a day can help keep your CPAP running efficiently for years to come.  If you have a CPAP, but are struggling with compliance, check out our no mask oral appliance, for those with mild to moderate sleep apnea.  Call and schedule a consultation with one of our sleep medicine physicians, or ask your doctor about a sleep referral to the [facility].

## 2021-09-12 ENCOUNTER — Encounter: Payer: Self-pay | Admitting: Nurse Practitioner

## 2021-09-12 ENCOUNTER — Other Ambulatory Visit: Payer: Self-pay

## 2021-09-12 DIAGNOSIS — E876 Hypokalemia: Secondary | ICD-10-CM

## 2021-09-12 MED ORDER — POTASSIUM CHLORIDE ER 20 MEQ PO TBCR: 20.0000 meq | EXTENDED_RELEASE_TABLET | Freq: Every day | ORAL | 1 refills | Status: DC

## 2021-09-12 NOTE — Telephone Encounter (Signed)
Chart supports Rx Last OV: 02/2021 Next OV: 03/2022

## 2021-09-21 LAB — POCT INR: INR: 3 — AB (ref 0.80–1.20)

## 2021-09-27 ENCOUNTER — Telehealth: Payer: Self-pay

## 2021-09-27 NOTE — Telephone Encounter (Signed)
INR has been abstracted

## 2021-10-05 LAB — POCT INR
INR: 3.1 — AB (ref 0.80–1.20)
INR: 3.1 — AB (ref <=1.20)

## 2021-10-11 ENCOUNTER — Telehealth: Payer: Self-pay

## 2021-10-11 LAB — POCT INR
INR: 2.2 — AB (ref 0.80–1.20)
INR: 3.1 — AB (ref <=1.20)

## 2021-10-11 NOTE — Telephone Encounter (Signed)
10/11/2021 INR Result: 2.2

## 2021-10-13 ENCOUNTER — Encounter: Payer: Self-pay | Admitting: Nurse Practitioner

## 2021-10-25 ENCOUNTER — Other Ambulatory Visit: Payer: Self-pay | Admitting: Nurse Practitioner

## 2021-10-25 DIAGNOSIS — Z1231 Encounter for screening mammogram for malignant neoplasm of breast: Secondary | ICD-10-CM

## 2021-11-01 ENCOUNTER — Ambulatory Visit: Payer: Managed Care, Other (non HMO) | Attending: Medical | Admitting: Medical

## 2021-11-01 ENCOUNTER — Encounter: Payer: Self-pay | Admitting: Medical

## 2021-11-01 VITALS — BP 130/82 | HR 58 | Ht 65.0 in | Wt 232.0 lb

## 2021-11-01 DIAGNOSIS — R011 Cardiac murmur, unspecified: Secondary | ICD-10-CM | POA: Diagnosis not present

## 2021-11-01 DIAGNOSIS — I2699 Other pulmonary embolism without acute cor pulmonale: Secondary | ICD-10-CM | POA: Diagnosis not present

## 2021-11-01 NOTE — Progress Notes (Signed)
Cardiology Office Note:    Date:  11/01/2021   ID:  Theresa Riley, DOB 01-01-1976, MRN [id number]  PCP:  Flossie Buffy, NP  [facility] Cardiologist:  None  [facility] Electrophysiologist:  None   Referring MD: Flossie Buffy, NP   Chief Complaint: 1 year follow-up  History of Present Illness:    Theresa Riley is a 46 y.o. female with a hx of pulmonary embolism 2014 and 2017 on warfarin, OSA, chronic lower extremity swelling, obesity who presents for follow-up.   Echo from 2017 at [facility] showed normal LVEF, normal RV size, PA pressure 35-40.   Last seen 06/2020 as a New patient and was overall doing well.   Today, the patient reports she has been doing well since the last visit. 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 Denies chest pain or shortness of breath. She tries to stay active. She does no formal activity. Diet is OK, could be better. She is using a CPAP machine. She denies lightheadedness, dizziness, palpitations, orthopnea or pnd. She had labs earlier this year. She reports dependent  Past Medical History:  Diagnosis Date   Pulmonary emboli ([place])    Yeast infection     Past Surgical History:  Procedure Laterality Date   WISDOM TOOTH EXTRACTION     x4    Current Medications: Current Meds  Medication Sig   acetaZOLAMIDE ER (DIAMOX) 500 MG capsule TAKE 2 CAPSULES IN THE MORNING AND 1 CAPSULE IN THE EVENING   Ascorbic Acid (VITAMIN C) 1000 MG tablet Take 1,000 mg by mouth daily.   Cholecalciferol (DIALYVITE VITAMIN D 5000 PO) Take 5,000 Units by mouth daily.   Potassium Chloride ER 20 MEQ TBCR Take 20 mEq by mouth daily.   vitamin B-12 (CYANOCOBALAMIN) 1000 MCG tablet Take 1,000 mcg by mouth daily.   warfarin (COUMADIN) 3 MG tablet 9mg  on Mon, Wed, Fri, and Sat. 7.5mg  on Tues, Thurs, and Sun.     Allergies:   Patient has no known allergies.   Social History   Socioeconomic History   Marital status: Single    Spouse  name: Not on file   Number of children: 2   Years of education: Not on file   Highest education level: Not on file  Occupational History   Not on file  Tobacco Use   Smoking status: Never   Smokeless tobacco: Never  Vaping Use   Vaping Use: Never used  Substance and Sexual Activity   Alcohol use: No   Drug use: No   Sexual activity: Yes    Birth control/protection: Abstinence  Other Topics Concern   Not on file  Social History Narrative   No caffeine use   Right Handed    Lives in a two story home    Social Determinants of Health   Financial Resource Strain: Not on file  Food Insecurity: Not on file  Transportation Needs: Not on file  Physical Activity: Not on file  Stress: Not on file  Social Connections: Not on file     Family History: The patient's family history includes Deep vein thrombosis in her brother; Pulmonary embolism in her brother.  ROS:   Please see the history of present illness.     All other systems reviewed and are negative.  EKGs/Labs/Other Studies Reviewed:    The following studies were reviewed today:  N/A  EKG:  EKG is ordered today.  The ekg ordered today demonstrates NSR 58bpm, TWI III,  no changes  Recent Labs: 02/16/2021: ALT 23; BUN 13; Creatinine, Ser 0.81; Hemoglobin 13.1; Platelets 259.0; Potassium 3.4; Sodium 139; TSH 1.00  Recent Lipid Panel    Component Value Date/Time   CHOL 157 02/16/2021 1025   TRIG 87.0 02/16/2021 1025   HDL 33.60 (L) 02/16/2021 1025   CHOLHDL 5 02/16/2021 1025   VLDL 17.4 02/16/2021 1025   LDLCALC 106 (H) 02/16/2021 1025   Physical Exam:    VS:  BP 130/82 (BP Location: Right Arm, Patient Position: Sitting, Cuff Size: Large)   Pulse (!) 58   Ht 5\' 5"  (1.651 m)   Wt 232 lb (105.2 kg)   LMP 02/14/2019   SpO2 98%   BMI 38.61 kg/m     Wt Readings from Last 3 Encounters:  11/01/21 232 lb (105.2 kg)  08/30/21 232 lb 8 oz (105.5 kg)  06/08/21 232 lb (105.2 kg)     GEN:  Well nourished, well  developed in no acute distress HEENT: Normal NECK: No JVD; No carotid bruits LYMPHATICS: No lymphadenopathy CARDIAC: RRR, +murmur, no rubs, gallops RESPIRATORY:  Clear to auscultation without rales, wheezing or rhonchi  ABDOMEN: Soft, non-tender, non-distended MUSCULOSKELETAL:  No edema; No deformity  SKIN: Warm and dry NEUROLOGIC:  Alert and oriented x 3 PSYCHIATRIC:  Normal affect   ASSESSMENT:    1. Pulmonary embolism, bilateral (HCC)   2. Heart murmur    PLAN:    In order of problems listed above:  H/o Recurrent Pulmonary embolism in 2014 and 2017 She is on chronic warfarin, refilled by her PCP. No significant shortness of breath. May need Lovenox or Eliquis bridging for procedures.   Murmur I will check an echocardiogram.   Disposition: Follow up in 1 year(s) with MD/APP   Signed, Johnedward Brodrick Ninfa Meeker, PA-C  11/01/2021 12:12 PM    [place]

## 2021-11-01 NOTE — Patient Instructions (Signed)
Medication Instructions:  NONE *If you need a refill on your cardiac medications before your next appointment, please call your pharmacy*   Lab Work: NONE If you have labs (blood work) drawn today and your tests are completely normal, you will receive your results only by: [place] (if you have MyChart) OR A paper copy in the mail If you have any lab test that is abnormal or we need to change your treatment, we will call you to review the results.   Testing/Procedures:  Your physician has requested that you have an echocardiogram. Echocardiography is a painless test that uses sound waves to create images of your heart. It provides your doctor with information about the size and shape of your heart and how well your heart's chambers and valves are working. This procedure takes approximately one hour. There are no restrictions for this procedure.    Follow-Up: At [facility], [place], you and your health needs are our priority.  As part of our continuing mission to provide you with exceptional heart care, we have created designated Provider Care Teams.  These Care Teams include your primary Cardiologist (physician) and Advanced Practice Providers (APPs -  Physician Assistants and Nurse Practitioners) who all work together to provide you with the care you need, when you need it.  We recommend signing up for the patient portal called "MyChart".  Sign up information is provided on this After Visit Summary.  MyChart is used to connect with patients for Virtual Visits (Telemedicine).  Patients are able to view lab/test results, encounter notes, upcoming appointments, etc.  Non-urgent messages can be sent to your provider as well.   To learn more about what you can do with MyChart, go to [email or web address].    Your next appointment:   12 month(s)  The format for your next appointment:   In Person  Provider:   You may see or one of the following Advanced Practice Providers on your  designated Care Team:   Murray Hodgkins, NP Christell Faith, PA-C Cadence Kathlen Mody, PA-C Gerrie Nordmann, NP   Important Information About Sugar

## 2021-11-04 NOTE — Progress Notes (Unsigned)
Assessment/Plan:   1.  Idiopathic intracranial hypertension (former pseudotumor cerebri)             -Very few records from her prior neurologist in [place] were received.  MRI from February, 2021 in [place] and from here and 2023 were stable, both revealing slitlike ventricles and a partially empty sella.  -LP never done because patient on Coumadin.  -MRV never done, but if she had a history of central venous thrombosis, patient is already on the treatment for it (Coumadin) as she has a history of PE.             -Discussed with the patient that because she cannot have lumbar punctures, we have to follow her through ophthalmologic testing.  She has been following with ophthalmology/neuro-ophthalmology at [facility].  Her examinations have been stable and we ([facility] and myself) have been decreasing her diamox.    -The recommendations from myself as well as that does have been to decrease the Diamox to 500 mg twice per day.  She has done this.  Ultimately, the goal is to get off the medicine.  -weight loss would be of value  -she has an appt on 12/02/21 with [facility].  If eye exam stable, we will likely wean off diamox.  -check bmp today.  Will dictate whether or not she needs to continue on K.  She went on that with higher dosages of diamox.     2.  Hx of R hemifacial spasm by history -I have wondered if, in fact, she does not have an old Bell's palsy on the right.  It certainly looks that way, with subsequent synkinesis and aberrant reinnervation resulting in the spasm that she describes.  While chronic hemifacial spasm can cause facial asymmetry, her "hemifacial spasm" is not bad enough to do that.  In fact, I do not really see hemifacial spasm.  I see some fasciculations near the eye and she describes some near the mouth, which really would point toward an aberrant, synkinetic reinnervation of the face.   She has had contrasted MRI brain very recently that was normal.  As  previous, we have never been able to get records from her prior neurologist in [place] and this is when it all started, several years ago.  She is most bothered by the asymmetry of the face and attempts at any Botox to help the fasciculations/spasms are only going to potentially make the asymmetry worse.             -none seen today but suspect that the asymmetry she notes is from this.  However, pt states that has not had spasm in the [facility] for 2 years (but has noted "twitch" in the R eye occasionally)  -MRI brain has been negative.   3.  Hx of multiple PE's             -on lifelong coumadin  4.  F/u 6 months   Subjective:   Theresa Riley was seen today in follow up for IIH.  Multiple records reviewed since last visit.  She has been to neuro-ophthalmology at [facility] for several visits since our last visit.  Records are reviewed.  At our last visit, I told the patient to decrease her Diamox to 500 mg twice per day.  Per ophthalmology records, it does not appear that she had done that and they recommended that she decrease it to 500 mg twice per day on the May visit.  It looks like  she went back on June 20 and recommendations again were to decrease the Diamox (it appears she was still on 1500 mg a day).  She states that she has done that.  She also had another opinion at [facility] regarding long-term anticoagulation, and it was recommended that she continue it.  Diamox: 500 mg 2 in the AM, 1 in the PM  CURRENT MEDICATIONS:  Outpatient Encounter Medications as of 11/08/2021  Medication Sig   acetaZOLAMIDE ER (DIAMOX) 500 MG capsule TAKE 2 CAPSULES IN THE MORNING AND 1 CAPSULE IN THE EVENING (Patient taking differently: TAKE 1 CAPSULES IN THE MORNING AND 1 CAPSULE IN THE EVENING)   Ascorbic Acid (VITAMIN C) 1000 MG tablet Take 1,000 mg by mouth daily.   Cholecalciferol (DIALYVITE VITAMIN D 5000 PO) Take 5,000 Units by mouth daily.   Potassium Chloride ER 20 MEQ TBCR Take 20 mEq by mouth daily.    vitamin B-12 (CYANOCOBALAMIN) 1000 MCG tablet Take 1,000 mcg by mouth daily.   warfarin (COUMADIN) 3 MG tablet 9mg  on Mon, Wed, Fri, and Sat. 7.5mg  on Tues, Thurs, and Sun.   No facility-administered encounter medications on file as of 11/08/2021.     Objective:   PHYSICAL EXAMINATION:    VITALS:   Vitals:   11/08/21 0929  BP: 122/83  Pulse: 77  SpO2: 99%  Weight: 230 lb 6.4 oz (104.5 kg)  Height: 5\' 5"  (1.651 m)   Wt Readings from Last 3 Encounters:  11/08/21 230 lb 6.4 oz (104.5 kg)  11/01/21 232 lb (105.2 kg)  08/30/21 232 lb 8 oz (105.5 kg)        GEN:  Normal appears female in no acute distress.  Appears stated age. HEENT:  Normocephalic, atraumatic. The mucous membranes are moist. The superficial temporal arteries are without ropiness or tenderness. Cardiovascular: Regular rate and rhythm. Lungs: Clear to auscultation bilaterally. Neck/Heme: There are no carotid bruits noted bilaterally.   NEUROLOGICAL: Orientation:  The patient is alert and oriented x 3.   Cranial nerves: NL folds equal at rest, but she does have decreased nasolabial fold on the right with smile.  She has asymmetric blink.  She has decreased forehead wrinkle on the right.  She has a single fasciculation at the right orbicularis oculi, medial, lower lid. she has mild ptosis on the L. Fundoscopic attempted but disc margins look good on the L, not well visualized on R.  Extraocular muscles are intact and visual fields are full to confrontational testing. Speech is fluent and clear. Soft palate rises symmetrically and there is no tongue deviation. Hearing is intact to conversational tone. Tone: Tone is good throughout. Sensation: Sensation is intact to light touch x 4 Coordination:  The patient has no difficulty with RAM's or FNF bilaterally. Motor: Strength is 5/5 in the bilateral upper and lower extremities.  Shoulder shrug is equal and symmetric. There is no pronator drift.  There are no fasciculations  noted with the exception of a single fasciculation at the right orbicularis oris oculi, described above.  I have reviewed and interpreted the following labs independently   Chemistry      Component Value Date/Time   NA 139 02/16/2021 1025   K 3.4 (L) 02/16/2021 1025   CL 111 02/16/2021 1025   CO2 22 02/16/2021 1025   BUN 13 02/16/2021 1025   CREATININE 0.81 02/16/2021 1025      Component Value Date/Time   CALCIUM 9.0 02/16/2021 1025   ALKPHOS 88 02/16/2021 1025   AST 20  02/16/2021 1025   ALT 23 02/16/2021 1025   BILITOT 0.4 02/16/2021 1025        Total time spent on today's visit was 25 minutes, including both face-to-face time and nonface-to-face time.  Time included that spent on review of records (prior notes available to me/labs/imaging if pertinent), discussing treatment and goals, answering patient's questions and coordinating care.  Cc:  Nche, Charlene Brooke, NP

## 2021-11-08 ENCOUNTER — Ambulatory Visit: Payer: Managed Care, Other (non HMO) | Admitting: Neurology

## 2021-11-08 ENCOUNTER — Other Ambulatory Visit (INDEPENDENT_AMBULATORY_CARE_PROVIDER_SITE_OTHER): Payer: Managed Care, Other (non HMO)

## 2021-11-08 ENCOUNTER — Encounter: Payer: Self-pay | Admitting: Neurology

## 2021-11-08 VITALS — BP 122/83 | HR 77 | Ht 65.0 in | Wt 230.4 lb

## 2021-11-08 DIAGNOSIS — G932 Benign intracranial hypertension: Secondary | ICD-10-CM

## 2021-11-08 DIAGNOSIS — Z5181 Encounter for therapeutic drug level monitoring: Secondary | ICD-10-CM

## 2021-11-08 LAB — BASIC METABOLIC PANEL
BUN: 11 mg/dL (ref 6–23)
CO2: 24 mEq/L (ref 19–32)
Calcium: 9.2 mg/dL (ref 8.4–10.5)
Chloride: 110 mEq/L (ref 96–112)
Creatinine, Ser: 0.88 mg/dL (ref 0.40–1.20)
GFR: 79.29 mL/min (ref >60.00)
Glucose, Bld: 98 mg/dL (ref 70–99)
Potassium: 3.6 mEq/L (ref 3.5–5.1)
Sodium: 138 mEq/L (ref 135–145)

## 2021-11-08 LAB — POCT INR: INR: 2.6 — AB (ref 0.80–1.20)

## 2021-11-08 MED ORDER — ACETAZOLAMIDE ER 500 MG PO CP12: ORAL_CAPSULE | ORAL | 1 refills | Status: DC

## 2021-11-08 NOTE — Patient Instructions (Signed)
Your provider has requested that you have labwork completed today. The lab is located on the Second floor at [address], within the [facility] office. When you get off the elevator, turn right and go in the [facility] [address]; the first brown door on the left.  Tell the ladies behind the desk that you are there for lab work. If you are not called within 15 minutes please check with the front desk.   Once you complete your labs you are free to go. You will receive a call or message via MyChart with your lab results.    

## 2021-11-09 ENCOUNTER — Telehealth: Payer: Self-pay

## 2021-11-09 DIAGNOSIS — Z7901 Long term (current) use of anticoagulants: Secondary | ICD-10-CM

## 2021-11-09 DIAGNOSIS — Z86711 Personal history of pulmonary embolism: Secondary | ICD-10-CM

## 2021-11-09 NOTE — Telephone Encounter (Signed)
Pt's INR Result from 11/08/21: 2.6

## 2021-11-10 ENCOUNTER — Ambulatory Visit
Admission: RE | Admit: 2021-11-10 | Discharge: 2021-11-10 | Disposition: A | Discharge status: Home / Self Care | Payer: Managed Care, Other (non HMO) | Source: Ambulatory Visit | Attending: Nurse Practitioner | Admitting: Nurse Practitioner

## 2021-11-10 DIAGNOSIS — Z1231 Encounter for screening mammogram for malignant neoplasm of breast: Secondary | ICD-10-CM

## 2021-11-24 ENCOUNTER — Ambulatory Visit: Payer: Managed Care, Other (non HMO) | Attending: Medical

## 2021-11-24 DIAGNOSIS — R011 Cardiac murmur, unspecified: Secondary | ICD-10-CM

## 2021-11-24 LAB — ECHOCARDIOGRAM COMPLETE
AR max vel: 2.25 cm2
AV Area VTI: 2.45 cm2
AV Area mean vel: 2.14 cm2
AV Mean grad: 3 mmHg
AV Peak grad: 5.4 mmHg
Ao pk vel: 1.16 m/s
Area-P 1/2: 4.6 cm2
Calc EF: 50.2 %
S' Lateral: 3 cm
Single Plane A2C EF: 52.2 %
Single Plane A4C EF: 49.5 %

## 2021-11-29 LAB — POCT INR: INR: 2.1 — AB (ref <=1.20)

## 2021-11-30 ENCOUNTER — Telehealth: Payer: Self-pay

## 2021-11-30 NOTE — Telephone Encounter (Signed)
INR: 2.1 on 11/29/2021

## 2021-12-22 LAB — POCT INR: INR: 2 — AB (ref 0.80–1.20)

## 2021-12-27 ENCOUNTER — Other Ambulatory Visit: Payer: Self-pay | Admitting: Physician Assistant

## 2021-12-27 DIAGNOSIS — G932 Benign intracranial hypertension: Secondary | ICD-10-CM

## 2022-01-12 ENCOUNTER — Encounter: Payer: Self-pay | Admitting: Nurse Practitioner

## 2022-01-12 LAB — POCT INR: INR: 2.2 — AB (ref 0.80–1.20)

## 2022-01-12 NOTE — Telephone Encounter (Signed)
Pt stated that she have had a fax sent from [facility] in [place] ,[place]  [phone]  from [facility] [email or web address]

## 2022-01-12 NOTE — Telephone Encounter (Signed)
Sent fax communication to Demetri[facility]ity requesting colonoscopy records

## 2022-01-13 ENCOUNTER — Encounter: Payer: Self-pay | Admitting: Nurse Practitioner

## 2022-01-13 ENCOUNTER — Ambulatory Visit: Payer: Managed Care, Other (non HMO) | Admitting: Nurse Practitioner

## 2022-01-13 VITALS — BP 126/78 | HR 75 | Temp 97.7°F | Wt 228.0 lb

## 2022-01-13 DIAGNOSIS — R1013 Epigastric pain: Secondary | ICD-10-CM

## 2022-01-13 MED ORDER — OMEPRAZOLE 20 MG PO CPDR: 20.0000 mg | DELAYED_RELEASE_CAPSULE | Freq: Every day | ORAL | 0 refills | Status: DC

## 2022-01-13 NOTE — Patient Instructions (Signed)
It was great to see you!  Start omeprazole 20mg  1 tablet daily on an empty stomach first thing in the morning.   Eat smaller meals more frequently, don't lay down after eating.   Let's follow-up if your symptoms worsen or don't improve.   Take care,  , NP

## 2022-01-13 NOTE — Assessment & Plan Note (Addendum)
Epigastric pain x2 weeks. Belching helps symptoms. No red flags on exam. Most likely reflux. Will have her start omeprazole 20mg  daily for 2 weeks. Eat smaller meals more frequently, don't lay down after eating. She is due for colonoscopy. PCP requested last colonoscopy results yesterday. Follow-up if symptoms don't improve or worsen.

## 2022-01-13 NOTE — Progress Notes (Signed)
Acute Office Visit  Subjective:     Patient ID: Theresa Riley, female    DOB: 10-Jul-1975, 46 y.o.   MRN: [id number]  Chief Complaint  Patient presents with   Other    Mid Abdominal discomfort. Belching helps with discomfort x 1 week. Last colonoscopy 2018    HPI Patient is in today for epigastric discomfort and pressure for the last 2 weeks. Sometimes the pain will go down to her LUQ. She states that belching will help. She has some slight nausea. She denies fevers, vomiting, diarrhea, constipation, and blood in her stool. She states the discomfort worsens with eating. She denies shortness of breath. She tried chamomile tea at home and warm apple cider vinegar which didn't help.   ROS See pertinent positives and negatives per HPI.     Objective:    BP 126/78 (BP Location: Left Arm, Patient Position: Sitting, Cuff Size: Large)   Pulse 75   Temp 97.7 F (36.5 C) (Temporal)   Wt 228 lb (103.4 kg)   LMP 02/14/2019   SpO2 98%   BMI 37.94 kg/m    Physical Exam Vitals and nursing note reviewed.  Constitutional:      General: She is not in acute distress.    Appearance: Normal appearance.  HENT:     Head: Normocephalic.  Eyes:     Conjunctiva/sclera: Conjunctivae normal.  Cardiovascular:     Rate and Rhythm: Normal rate and regular rhythm.     Pulses: Normal pulses.     Heart sounds: Normal heart sounds.  Pulmonary:     Effort: Pulmonary effort is normal.     Breath sounds: Normal breath sounds.  Abdominal:     Tenderness: There is abdominal tenderness in the epigastric area. There is no guarding or rebound. Negative signs include Murphy's sign, Rovsing's sign and McBurney's sign.     Hernia: No hernia is present.  Musculoskeletal:     Cervical back: Normal range of motion.  Skin:    General: Skin is warm.  Neurological:     General: No focal deficit present.     Mental Status: She is alert and oriented to person, place, and time.  Psychiatric:        Mood and  Affect: Mood normal.        Behavior: Behavior normal.        Thought Content: Thought content normal.        Judgment: Judgment normal.     Results for orders placed or performed in visit on 01/13/22  POCT INR  Result Value Ref Range   INR 2.20 (A) 0.80 - 1.20        Assessment & Plan:   Problem List Items Addressed This Visit       Other   Epigastric pain - Primary    Epigastric pain x2 weeks. Belching helps symptoms. No red flags on exam. Most likely reflux. Will have her start omeprazole 20mg  daily for 2 weeks. Eat smaller meals more frequently, don't lay down after eating. She is due for colonoscopy. PCP requested last colonoscopy results yesterday. Follow-up if symptoms don't improve or worsen.        Meds ordered this encounter  Medications   omeprazole (PRILOSEC) 20 MG capsule    Sig: Take 1 capsule (20 mg total) by mouth daily.    Dispense:  14 capsule    Refill:  0    Return if symptoms worsen or fail to improve.  , NP

## 2022-01-31 ENCOUNTER — Encounter: Payer: Self-pay | Admitting: Nurse Practitioner

## 2022-01-31 DIAGNOSIS — K635 Polyp of colon: Secondary | ICD-10-CM

## 2022-01-31 DIAGNOSIS — K219 Gastro-esophageal reflux disease without esophagitis: Secondary | ICD-10-CM

## 2022-01-31 DIAGNOSIS — Z1211 Encounter for screening for malignant neoplasm of colon: Secondary | ICD-10-CM

## 2022-01-31 DIAGNOSIS — Z86010 Personal history of colonic polyps: Secondary | ICD-10-CM

## 2022-01-31 LAB — POCT INR: INR: 2.2 — AB (ref 0.80–1.20)

## 2022-01-31 MED ORDER — OMEPRAZOLE 20 MG PO CPDR: 20.0000 mg | DELAYED_RELEASE_CAPSULE | Freq: Every day | ORAL | 0 refills | Status: DC

## 2022-02-01 ENCOUNTER — Ambulatory Visit: Payer: Managed Care, Other (non HMO) | Admitting: Nurse Practitioner

## 2022-02-10 LAB — POCT INR: INR: 3 — AB (ref 0.80–1.20)

## 2022-03-03 LAB — POCT INR: INR: 3.7 — AB (ref 0.80–1.20)

## 2022-03-07 LAB — POCT INR: INR: 2.1 — AB (ref 0.80–1.20)

## 2022-03-23 ENCOUNTER — Ambulatory Visit (INDEPENDENT_AMBULATORY_CARE_PROVIDER_SITE_OTHER): Payer: Managed Care, Other (non HMO) | Admitting: Nurse Practitioner

## 2022-03-23 ENCOUNTER — Encounter: Payer: Self-pay | Admitting: Nurse Practitioner

## 2022-03-23 VITALS — BP 120/78 | HR 68 | Temp 98.0°F | Resp 16 | Ht 65.0 in | Wt 226.0 lb

## 2022-03-23 DIAGNOSIS — Z136 Encounter for screening for cardiovascular disorders: Secondary | ICD-10-CM

## 2022-03-23 DIAGNOSIS — Z13220 Encounter for screening for lipoid disorders: Secondary | ICD-10-CM

## 2022-03-23 DIAGNOSIS — Z86711 Personal history of pulmonary embolism: Secondary | ICD-10-CM | POA: Diagnosis not present

## 2022-03-23 DIAGNOSIS — Z0001 Encounter for general adult medical examination with abnormal findings: Secondary | ICD-10-CM

## 2022-03-23 DIAGNOSIS — Z0000 Encounter for general adult medical examination without abnormal findings: Secondary | ICD-10-CM

## 2022-03-23 DIAGNOSIS — Z6837 Body mass index (BMI) 37.0-37.9, adult: Secondary | ICD-10-CM

## 2022-03-23 LAB — COMPREHENSIVE METABOLIC PANEL
ALT: 21 U/L (ref 0–35)
AST: 19 U/L (ref 0–37)
Albumin: 4.1 g/dL (ref 3.5–5.2)
Alkaline Phosphatase: 100 U/L (ref 39–117)
BUN: 10 mg/dL (ref 6–23)
CO2: 32 mEq/L (ref 19–32)
Calcium: 9.3 mg/dL (ref 8.4–10.5)
Chloride: 103 mEq/L (ref 96–112)
Creatinine, Ser: 0.83 mg/dL (ref 0.40–1.20)
GFR: 84.83 mL/min (ref >60.00)
Glucose, Bld: 92 mg/dL (ref 70–99)
Potassium: 4.1 mEq/L (ref 3.5–5.1)
Sodium: 141 mEq/L (ref 135–145)
Total Bilirubin: 0.4 mg/dL (ref 0.2–1.2)
Total Protein: 6.8 g/dL (ref 6.0–8.3)

## 2022-03-23 LAB — LIPID PANEL
Cholesterol: 158 mg/dL (ref 0–200)
HDL: 34.7 mg/dL — ABNORMAL LOW (ref >39.00)
LDL Cholesterol: 107 mg/dL — ABNORMAL HIGH (ref 0–99)
NonHDL: 123.06
Total CHOL/HDL Ratio: 5
Triglycerides: 81 mg/dL (ref 0.0–149.0)
VLDL: 16.2 mg/dL (ref 0.0–40.0)

## 2022-03-23 MED ORDER — WARFARIN SODIUM 3 MG PO TABS: ORAL_TABLET | ORAL | 1 refills | Status: DC

## 2022-03-23 NOTE — Assessment & Plan Note (Signed)
Therapeutic INR 2.5 yesterday with current coumadin dose INR goal 2-3 No signs of abnormal bleed No CP, SOB, or LE edema or syncope Repeat cbc and maintain coumadin dose

## 2022-03-23 NOTE — Progress Notes (Signed)
Complete physical exam  Patient: Theresa Riley   DOB: 1975-09-12   47 y.o. Female  MRN: [id number] Visit Date: 03/23/2022  Subjective:    Chief Complaint  Patient presents with   Annual Exam    Not fasting -  ate trail mix on the way to appt   Theresa Riley is a 47 y.o. female who presents today for a complete physical exam. She reports consuming a general diet.  Walking 1-2x/week  She generally feels well. She reports sleeping well. She does have additional problems to discuss today.  Vision:Yes Dental:Yes STD Screen:No  BP Readings from Last 3 Encounters:  03/23/22 120/78  01/13/22 126/78  11/08/21 122/83   Wt Readings from Last 3 Encounters:  03/23/22 226 lb (102.5 kg)  01/13/22 228 lb (103.4 kg)  11/08/21 230 lb 6.4 oz (104.5 kg)   Most recent fall risk assessment:    03/23/2022   11:38 AM  Fall Risk   Falls in the past year? 0  Injury with Fall? 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   Pulmonary emboli (HCC)    Sleep apnea    Yeast infection    Past Surgical History:  Procedure Laterality Date   WISDOM TOOTH EXTRACTION     x4   Social History   Socioeconomic History   Marital status: Single    Spouse name: Not on file   Number of children: 2   Years of education: Not on file   Highest education level: Not on file  Occupational History   Not on file  Tobacco Use   Smoking status: Never   Smokeless tobacco: Never  Vaping Use   Vaping Use: Never used  Substance and Sexual  Activity   Alcohol use: Never   Drug use: Never   Sexual activity: Not Currently    Birth control/protection: Abstinence, None  Other Topics Concern   Not on file  Social History Narrative   No caffeine use   Right Handed    Lives in a two story home    Social Determinants of Health   Financial Resource Strain: Not on file  Food Insecurity: Not on file  Transportation Needs: Not on file  Physical Activity: Not on file  Stress: Not on file  Social Connections: Not on file  Intimate Partner Violence: Not on file   Family Status  Relation Name Status   PGF  Deceased   PGM  Deceased   MGM  Alive   MGF  Deceased   Father  Alive   Mother  Alive   Brother  Alive   Family History  Problem Relation Age of Onset   Pulmonary embolism Brother    Deep vein thrombosis Brother    No Known Allergies  Patient Care Team: Tishara Pizano, Charlene Brooke, NP as PCP - General (Internal Medicine) Tat, Eustace Quail, DO as Consulting Physician (Neurology)   Medications: Outpatient Medications Prior to Visit  Medication Sig   Ascorbic Acid (VITAMIN C) 1000 MG tablet Take 1,000 mg by mouth daily.   Cholecalciferol (DIALYVITE VITAMIN D 5000 PO) Take 5,000 Units by mouth daily.   omeprazole (PRILOSEC) 20 MG capsule Take 1 capsule (20 mg total) by mouth daily.   vitamin B-12 (CYANOCOBALAMIN) 1000 MCG tablet Take 1,000 mcg by mouth daily.   [DISCONTINUED] warfarin (COUMADIN) 3 MG tablet 9mg  on Mon, Wed, Fri, and Sat. 7.5mg  on Tues, Thurs, and Sun.   [DISCONTINUED] acetaZOLAMIDE ER (DIAMOX) 500 MG capsule TAKE 2 CAPSULES IN THE MORNING AND 1 CAPSULE IN THE EVENING (Patient not taking: Reported on 03/23/2022)   [DISCONTINUED] Potassium Chloride ER 20 MEQ TBCR Take 20 mEq by mouth daily. (Patient not taking: Reported on 03/23/2022)   No facility-administered medications prior to visit.   Review of Systems  Constitutional:  Negative for fever.  HENT:  Negative for congestion and sore throat.   Eyes:         Negative for visual changes  Respiratory:  Negative for cough and shortness of breath.   Cardiovascular:  Negative for chest pain, palpitations and leg swelling.  Gastrointestinal:  Negative for abdominal distention, abdominal pain, anal bleeding, blood in stool, constipation, diarrhea and nausea.  Genitourinary:  Negative for dysuria, frequency, hematuria, urgency and vaginal bleeding.  Musculoskeletal:  Negative for myalgias.  Skin:  Negative for rash.  Neurological:  Negative for dizziness and headaches.  Hematological:  Does not bruise/bleed easily.  Psychiatric/Behavioral:  Negative for suicidal ideas. The patient is not nervous/anxious.        Objective:  BP 120/78   Pulse 68   Temp 98 F (36.7 C) (Temporal)   Resp 16   Ht 5\' 5"  (1.651 m)   Wt 226 lb (102.5 kg)   LMP 02/14/2019   SpO2 98%   BMI 37.61 kg/m     Physical Exam Vitals and nursing note reviewed.  Constitutional:      General: She is not in acute distress.    Appearance: She is obese.  HENT:     Right Ear: Tympanic membrane, ear canal and external ear normal.     Left Ear: Tympanic membrane, ear canal and external ear normal.     Nose: Nose normal.     Mouth/Throat:     Pharynx: No oropharyngeal exudate.  Eyes:     General: No scleral icterus.    Extraocular Movements: Extraocular movements intact.     Conjunctiva/sclera: Conjunctivae normal.     Pupils: Pupils are equal, round, and reactive to light.  Cardiovascular:     Rate and Rhythm: Normal rate and regular rhythm.     Pulses: Normal pulses.     Heart sounds: Normal heart sounds.  Pulmonary:     Effort: Pulmonary effort is normal. No respiratory distress.     Breath sounds: Normal breath sounds.  Abdominal:     General: Bowel sounds are normal. There is no distension.     Palpations: Abdomen is soft.  Genitourinary:    Comments: Deferred breast and pelvic exam to GYN Musculoskeletal:        General: Normal range of motion.     Cervical  back: Normal range of motion and neck supple.     Right lower leg: No edema.     Left lower leg: No edema.  Lymphadenopathy:     Cervical: No cervical adenopathy.  Skin:    General: Skin is warm and dry.  Neurological:     Mental  Status: She is alert and oriented to person, place, and time.  Psychiatric:        Mood and Affect: Mood normal.        Behavior: Behavior normal.        Thought Content: Thought content normal.     No results found for any visits on 03/23/22.    Assessment & Plan:    Routine Health Maintenance and Physical Exam  Immunization History  Administered Date(s) Administered   PFIZER(Purple Top)SARS-COV-2 Vaccination 12/16/2019, 01/06/2020   Tdap 09/04/2007   Health Maintenance  Topic Date Due   Hepatitis C Screening  Never done   COVID-19 Vaccine (3 - Pfizer risk series) 02/03/2020   PAP SMEAR-Modifier  07/21/2021   INFLUENZA VACCINE  05/14/2022 (Originally 09/13/2021)   COLONOSCOPY (Pts 45-51yrs Insurance coverage will need to be confirmed)  04/26/2026   HIV Screening  Completed   HPV VACCINES  Aged Out   DTaP/Tdap/Td  Discontinued   Discussed health benefits of physical activity, and encouraged her to engage in regular exercise appropriate for her age and condition.  Problem List Items Addressed This Visit       Other   History of pulmonary embolism    Therapeutic INR 2.5 yesterday with current coumadin dose INR goal 2-3 No signs of abnormal bleed No CP, SOB, or LE edema or syncope Repeat cbc and maintain coumadin dose      Relevant Medications   warfarin (COUMADIN) 3 MG tablet   Obesity   Other Visit Diagnoses     Encounter for preventative adult health care exam with abnormal findings    -  Primary   Relevant Orders   Comp Met (CMET)   Lipid Profile   CBC   Encounter for lipid screening for cardiovascular disease       Relevant Orders   Lipid Profile      Return in about 1 year (around 03/24/2023) for CPE (fasting).      Wilfred Lacy, NP

## 2022-03-23 NOTE — Patient Instructions (Signed)
Maintain current med dose Go to lab Sign medical release to get records from Dr. Maudry Diego.  Preventive Care 42-46 Years Old, Female Preventive care refers to lifestyle choices and visits with your health care provider that can promote health and wellness. Preventive care visits are also called wellness exams. What can I expect for my preventive care visit? Counseling Your health care provider may ask you questions about your: Medical history, including: Past medical problems. Family medical history. Pregnancy history. Current health, including: Menstrual cycle. Method of birth control. Emotional well-being. Home life and relationship well-being. Sexual activity and sexual health. Lifestyle, including: Alcohol, nicotine or tobacco, and drug use. Access to firearms. Diet, exercise, and sleep habits. Work and work Statistician. Sunscreen use. Safety issues such as seatbelt and bike helmet use. Physical exam Your health care provider will check your: Height and weight. These may be used to calculate your BMI (body mass index). BMI is a measurement that tells if you are at a healthy weight. Waist circumference. This measures the distance around your waistline. This measurement also tells if you are at a healthy weight and may help predict your risk of certain diseases, such as type 2 diabetes and high blood pressure. Heart rate and blood pressure. Body temperature. Skin for abnormal spots. What immunizations do I need?  Vaccines are usually given at various ages, according to a schedule. Your health care provider will recommend vaccines for you based on your age, medical history, and lifestyle or other factors, such as travel or where you work. What tests do I need? Screening Your health care provider may recommend screening tests for certain conditions. This may include: Lipid and cholesterol levels. Diabetes screening. This is done by checking your blood sugar (glucose) after you  have not eaten for a while (fasting). Pelvic exam and Pap test. Hepatitis B test. Hepatitis C test. HIV (human immunodeficiency virus) test. STI (sexually transmitted infection) testing, if you are at risk. Lung cancer screening. Colorectal cancer screening. Mammogram. Talk with your health care provider about when you should start having regular mammograms. This may depend on whether you have a family history of breast cancer. BRCA-related cancer screening. This may be done if you have a family history of breast, ovarian, tubal, or peritoneal cancers. Bone density scan. This is done to screen for osteoporosis. Talk with your health care provider about your test results, treatment options, and if necessary, the need for more tests. Follow these instructions at home: Eating and drinking  Eat a diet that includes fresh fruits and vegetables, whole grains, lean protein, and low-fat dairy products. Take vitamin and mineral supplements as recommended by your health care provider. Do not drink alcohol if: Your health care provider tells you not to drink. You are pregnant, may be pregnant, or are planning to become pregnant. If you drink alcohol: Limit how much you have to 0-1 drink a day. Know how much alcohol is in your drink. In the [place] one drink equals one 12 oz bottle of beer (355 mL), one 5 oz glass of wine (148 mL), or one 1 oz glass of hard liquor (44 mL). Lifestyle Brush your teeth every morning and night with fluoride toothpaste. Floss one time each day. Exercise for at least 30 minutes 5 or more days each week. Do not use any products that contain nicotine or tobacco. These products include cigarettes, chewing tobacco, and vaping devices, such as e-cigarettes. If you need help quitting, ask your health care provider. Do not use drugs.  If you are sexually active, practice safe sex. Use a condom or other form of protection to prevent STIs. If you do not wish to become pregnant, use  a form of birth control. If you plan to become pregnant, see your health care provider for a prepregnancy visit. Take aspirin only as told by your health care provider. Make sure that you understand how much to take and what form to take. Work with your health care provider to find out whether it is safe and beneficial for you to take aspirin daily. Find healthy ways to manage stress, such as: Meditation, yoga, or listening to music. Journaling. Talking to a trusted person. Spending time with friends and family. Minimize exposure to UV radiation to reduce your risk of skin cancer. Safety Always wear your seat belt while driving or riding in a vehicle. Do not drive: If you have been drinking alcohol. Do not ride with someone who has been drinking. When you are tired or distracted. While texting. If you have been using any mind-altering substances or drugs. Wear a helmet and other protective equipment during sports activities. If you have firearms in your house, make sure you follow all gun safety procedures. Seek help if you have been physically or sexually abused. What's next? Visit your health care provider once a year for an annual wellness visit. Ask your health care provider how often you should have your eyes and teeth checked. Stay up to date on all vaccines. This information is not intended to replace advice given to you by your health care provider. Make sure you discuss any questions you have with your health care provider. Document Revised: 07/28/2020 Document Reviewed: 07/28/2020 Elsevier Patient Education  [place].

## 2022-03-31 NOTE — Progress Notes (Signed)
Abnormal: Low HDL and elevated LDL: need to maintain heart health diet and daily exercise to improve these numbers Normal CMP Pending CBC

## 2022-04-11 NOTE — Progress Notes (Signed)
.  lb

## 2022-04-11 NOTE — Progress Notes (Signed)
Made patient aware of results and Charlotte's instructions.  Pt expressed understanding

## 2022-04-26 ENCOUNTER — Encounter: Payer: Self-pay | Admitting: Nurse Practitioner

## 2022-05-02 ENCOUNTER — Encounter: Payer: Self-pay | Admitting: Nurse Practitioner

## 2022-05-02 ENCOUNTER — Ambulatory Visit: Payer: Managed Care, Other (non HMO) | Admitting: Nurse Practitioner

## 2022-05-02 VITALS — BP 140/98 | HR 79 | Temp 97.8°F | Resp 16 | Ht 65.0 in | Wt 227.8 lb

## 2022-05-02 DIAGNOSIS — G8929 Other chronic pain: Secondary | ICD-10-CM | POA: Diagnosis not present

## 2022-05-02 DIAGNOSIS — M25562 Pain in left knee: Secondary | ICD-10-CM | POA: Diagnosis not present

## 2022-05-02 DIAGNOSIS — M25561 Pain in right knee: Secondary | ICD-10-CM

## 2022-05-02 DIAGNOSIS — R030 Elevated blood-pressure reading, without diagnosis of hypertension: Secondary | ICD-10-CM | POA: Diagnosis not present

## 2022-05-02 NOTE — Progress Notes (Signed)
Established Patient Visit  Patient: Theresa Riley   DOB: 1975/06/20   47 y.o. Female  MRN: [id number] Visit Date: 05/02/2022  Subjective:    Chief Complaint  Patient presents with   Knee Pain    Bilateral   Elevated BP without diagnosis of hypertension Monitor BP 2-3x/week in AM. Call office if BP remains>140/80 BP Readings from Last 3 Encounters:  05/02/22 (!) 140/98  03/23/22 120/78  01/13/22 126/78     Chronic pain of both knees Acute on chronic posterior knee pain, intermittent, worse after increased walking at conference last week, pain alternates from one knee to another. Occasional associated with posterior knee swelling. Occurs randomly. Worse with hot compress. Denies any injury She typically wears shoes with adequate support She has no pain fast pace walking.  Use voltaren gel every 8hrs as needed for pain Use knee sleeve to manage knee swelling. Provided home exercise Call office if no improvement in 2-4weeks Consider referral to sports medicine if no improvement  Wt Readings from Last 3 Encounters:  05/02/22 227 lb 12.8 oz (103.3 kg)  03/23/22 226 lb (102.5 kg)  01/13/22 228 lb (103.4 kg)    BP Readings from Last 3 Encounters:  05/02/22 (!) 140/98  03/23/22 120/78  01/13/22 126/78    Reviewed medical, surgical, and social history today  Medications: Outpatient Medications Prior to Visit  Medication Sig   Ascorbic Acid (VITAMIN C) 1000 MG tablet Take 1,000 mg by mouth daily.   Cholecalciferol (DIALYVITE VITAMIN D 5000 PO) Take 5,000 Units by mouth daily.   omeprazole (PRILOSEC) 20 MG capsule Take 1 capsule (20 mg total) by mouth daily.   vitamin B-12 (CYANOCOBALAMIN) 1000 MCG tablet Take 1,000 mcg by mouth daily.   warfarin (COUMADIN) 3 MG tablet 9mg  on Mon, Wed, Fri, and Sat. 7.5mg  on Tues, Thurs, and Sun.   No facility-administered medications prior to visit.   Reviewed past medical and social history.   ROS per HPI above       Objective:  BP (!) 140/98   Pulse 79   Temp 97.8 F (36.6 C) (Temporal)   Resp 16   Ht 5\' 5"  (1.651 m)   Wt 227 lb 12.8 oz (103.3 kg)   LMP 02/14/2019   SpO2 99%   BMI 37.91 kg/m      Physical Exam Musculoskeletal:     Right knee: Crepitus present. No effusion, erythema or bony tenderness. Normal range of motion. No tenderness. Normal alignment, normal meniscus and normal patellar mobility.     Instability Tests: Anterior drawer test negative. Posterior drawer test negative.     Left knee: Crepitus present. No effusion, erythema or bony tenderness. Normal range of motion. No tenderness. Normal alignment, normal meniscus and normal patellar mobility.     Instability Tests: Anterior drawer test negative. Posterior drawer test negative.     Right lower leg: Normal.     Left lower leg: Normal.     Right ankle: Normal.     Left ankle: Normal.     No results found for any visits on 05/02/22.    Assessment & Plan:    Problem List Items Addressed This Visit       Other   Chronic pain of both knees - Primary    Acute on chronic posterior knee pain, intermittent, worse after increased walking at conference last week, pain alternates from one knee to another. Occasional associated with  posterior knee swelling. Occurs randomly. Worse with hot compress. Denies any injury She typically wears shoes with adequate support She has no pain fast pace walking.  Use voltaren gel every 8hrs as needed for pain Use knee sleeve to manage knee swelling. Provided home exercise Call office if no improvement in 2-4weeks Consider referral to sports medicine if no improvement      Elevated BP without diagnosis of hypertension    Monitor BP 2-3x/week in AM. Call office if BP remains>140/80 BP Readings from Last 3 Encounters:  05/02/22 (!) 140/98  03/23/22 120/78  01/13/22 126/78         Return if symptoms worsen or fail to improve.     Wilfred Lacy, NP

## 2022-05-02 NOTE — Patient Instructions (Addendum)
Use voltaren gel every 8hrs as needed for pain Use knee sleeve to manage knee swelling. Start daily exercise Call office if no improvement in 2-4weeks Schedule appt with GYN for repeat PAP. Monitor BP 2-3x/week in AM. Call office if BP remains>140/80  Knee Exercises Ask your health care provider which exercises are safe for you. Do exercises exactly as told by your health care provider and adjust them as directed. It is normal to feel mild stretching, pulling, tightness, or discomfort as you do these exercises. Stop right away if you feel sudden pain or your pain gets worse. Do not begin these exercises until told by your health care provider. Stretching and range-of-motion exercises These exercises warm up your muscles and joints and improve the movement and flexibility of your knee. These exercises also help to relieve pain and swelling. Knee extension, prone  Lie on your abdomen (prone position) on a bed. Place your left / right knee just beyond the edge of the surface so your knee is not on the bed. You can put a towel under your left / right thigh just above your kneecap for comfort. Relax your leg muscles and allow gravity to straighten your knee (extension). You should feel a stretch behind your left / right knee. Hold this position for _____10_____ seconds. Scoot up so your knee is supported between repetitions. Repeat ____10______ times. Complete this exercise ______1____ times a day. Knee flexion, active  Lie on your back with both legs straight. If this causes back discomfort, bend your left / right knee so your foot is flat on the floor. Slowly slide your left / right heel back toward your buttocks. Stop when you feel a gentle stretch in the front of your knee or thigh (flexion). Hold this position for _____10_____ seconds. Slowly slide your left / right heel back to the starting position. Repeat ____10______ times. Complete this exercise ______1____ times a day. Quadriceps  stretch, prone  Lie on your abdomen on a firm surface, such as a bed or padded floor. Bend your left / right knee and hold your ankle. If you cannot reach your ankle or pant leg, loop a belt around your foot and grab the belt instead. Gently pull your heel toward your buttocks. Your knee should not slide out to the side. You should feel a stretch in the front of your thigh and knee (quadriceps). Hold this position for ___10_______ seconds. Repeat ___10_______ times. Complete this exercise _____1_____ times a day. Hamstring, supine  Lie on your back (supine position). Loop a belt or towel over the ball of your left / right foot. The ball of your foot is on the walking surface, right under your toes. Straighten your left / right knee and slowly pull on the belt to raise your leg until you feel a gentle stretch behind your knee (hamstring). Do not let your knee bend while you do this. Keep your other leg flat on the floor. Hold this position for ___10_______ seconds. Repeat ____10______ times. Complete this exercise _____1_____ times a day. Strengthening exercises These exercises build strength and endurance in your knee. Endurance is the ability to use your muscles for a long time, even after they get tired. Quadriceps, isometric This exercise strengthens the muscles in front of your thigh (quadriceps) without moving your knee joint (isometric). Lie on your back with your left / right leg extended and your other knee bent. Put a rolled towel or small pillow under your knee if told by your health care  provider. Slowly tense the muscles in the front of your left / right thigh. You should see your kneecap slide up toward your hip or see increased dimpling just above the knee. This motion will push the back of the knee toward the floor. For _____10_____ seconds, hold the muscle as tight as you can without increasing your pain. Relax the muscles slowly and completely. Repeat ____10______ times.  Complete this exercise ______1____ times a day. Straight leg raises This exercise strengthens the muscles in front of your thigh (quadriceps) and the muscles that move your hips (hip flexors). Lie on your back with your left / right leg extended and your other knee bent. Tense the muscles in the front of your left / right thigh. You should see your kneecap slide up or see increased dimpling just above the knee. Your thigh may even shake a bit. Keep these muscles tight as you raise your leg 4-6 inches (10-15 cm) off the floor. Do not let your knee bend. Hold this position for _____10_____ seconds. Keep these muscles tense as you lower your leg. Relax your muscles slowly and completely after each repetition. Repeat _____10_____ times. Complete this exercise _____1_____ times a day. Hamstring, isometric  Lie on your back on a firm surface. Bend your left / right knee about __________ degrees. Dig your left / right heel into the surface as if you are trying to pull it toward your buttocks. Tighten the muscles in the back of your thighs (hamstring) to "dig" as hard as you can without increasing any pain. Hold this position for ____10______ seconds. Release the tension gradually and allow your muscles to relax completely for __________ seconds after each repetition. Repeat ____10______ times. Complete this exercise _____1_____ times a day. Hamstring curls If told by your health care provider, do this exercise while wearing ankle weights. Begin with ____1-3______lb weights. Then increase the weight by 1 lb (0.5 kg) increments. Do not wear ankle weights that are more than ____5______lb Theresa Riley on your abdomen with your legs straight. Bend your left / right knee as far as you can without feeling pain. Keep your hips flat against the floor. Hold this position for ____10______ seconds. Slowly lower your leg to the starting position. Repeat ____10______ times. Complete this exercise _____1_____ times a  day. Squats This exercise strengthens the muscles in front of your thigh and knee (quadriceps). Stand in front of a table, with your feet and knees pointing straight ahead. You may rest your hands on the table for balance but not for support. Slowly bend your knees and lower your hips like you are going to sit in a chair. Keep your weight over your heels, not over your toes. Keep your lower legs upright so they are parallel with the table legs. Do not let your hips go lower than your knees. Do not bend lower than told by your health care provider. If your knee pain increases, do not bend as low. Hold the squat position for ___10_______ seconds. Slowly push with your legs to return to standing. Do not use your hands to pull yourself to standing. Repeat _____10_____ times. Complete this exercise _______1___ times a day. Wall slides This exercise strengthens the muscles in front of your thigh and knee (quadriceps). Lean your back against a smooth wall or door, and walk your feet out 18-24 inches (46-61 cm) from it. Place your feet hip-width apart. Slowly slide down the wall or door until your knees bend _____90_____ degrees. Keep your knees over your  heels, not over your toes. Keep your knees in line with your hips. Hold this position for __10________ seconds. Repeat ___10_______ times. Complete this exercise _____1_____ times a day. Straight leg raises, side-lying This exercise strengthens the muscles that rotate the leg at the hip and move it away from your body (hip abductors). Lie on your side with your left / right leg in the top position. Lie so your head, shoulder, knee, and hip line up. You may bend your bottom knee to help you keep your balance. Roll your hips slightly forward so your hips are stacked directly over each other and your left / right knee is facing forward. Leading with your heel, lift your top leg 4-6 inches (10-15 cm). You should feel the muscles in your outer hip  lifting. Do not let your foot drift forward. Do not let your knee roll toward the ceiling. Hold this position for ___10_______ seconds. Slowly return your leg to the starting position. Let your muscles relax completely after each repetition. Repeat ____10______ times. Complete this exercise ____1______ times a day. Straight leg raises, prone This exercise stretches the muscles that move your hips away from the front of the pelvis (hip extensors). Lie on your abdomen on a firm surface. You can put a pillow under your hips if that is more comfortable. Tense the muscles in your buttocks and lift your left / right leg about 4-6 inches (10-15 cm). Keep your knee straight as you lift your leg. Hold this position for _____10_____ seconds. Slowly lower your leg to the starting position. Let your leg relax completely after each repetition. Repeat ____10______ times. Complete this exercise _____1_____ times a day. This information is not intended to replace advice given to you by your health care provider. Make sure you discuss any questions you have with your health care provider. Document Revised: 10/12/2020 Document Reviewed: 10/12/2020 Elsevier Patient Education  Theresa Riley.

## 2022-05-02 NOTE — Assessment & Plan Note (Signed)
Acute on chronic posterior knee pain, intermittent, worse after increased walking at conference last week, pain alternates from one knee to another. Occasional associated with posterior knee swelling. Occurs randomly. Worse with hot compress. Denies any injury She typically wears shoes with adequate support She has no pain fast pace walking.  Use voltaren gel every 8hrs as needed for pain Use knee sleeve to manage knee swelling. Provided home exercise Call office if no improvement in 2-4weeks Consider referral to sports medicine if no improvement

## 2022-05-02 NOTE — Assessment & Plan Note (Signed)
Monitor BP 2-3x/week in AM. Call office if BP remains>140/80 BP Readings from Last 3 Encounters:  05/02/22 (!) 140/98  03/23/22 120/78  01/13/22 126/78

## 2022-05-09 NOTE — Progress Notes (Unsigned)
Assessment/Plan:   1.  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  MRI from February, 2021 in [place] and from here and 2023 were stable, both revealing slitlike ventricles and a partially empty sella.  -LP never done at dx in [facility] because patient on Coumadin.  -MRV never done, but if she had a history of central venous thrombosis, patient is already on the treatment for it (Coumadin) as she has a history of PE.             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  -The recommendations from myself as well as that does have been to decrease the Diamox to 500 mg twice per day.  She has done this.  Ultimately, the goal is to get off the medicine.  -weight loss would be of value  -she has an appt on 12/02/21 with [facility] neuro[facility].  If eye exam stable, we will likely wean off diamox.  -check bmp today.  Will dictate whether or not she needs to continue on K.  She went on that with higher dosages of diamox.     2.  Hx of R hemifacial spasm by history -I have wondered if, in fact, she does not have an old Bell's palsy on the right.  It certainly looks that way, with subsequent synkinesis and aberrant reinnervation resulting in the spasm that she describes.  While chronic hemifacial spasm can cause facial asymmetry, her "hemifacial spasm" is not bad enough to do that.  In fact, I do not really see hemifacial spasm.  I see some fasciculations near the eye and she describes some near the mouth, which really would point toward an aberrant, synkinetic reinnervation of the face.   She has had contrasted MRI brain  very recently that was normal.  As previous, we have never been able to get records from her prior neurologist in [place] and this is when it all started, several years ago.  She is most bothered by the asymmetry of the face and attempts at any Botox to help the fasciculations/spasms are only going to potentially make the asymmetry worse.             -none seen today but suspect that the asymmetry she notes is from this.  However, pt states that has not had spasm in the [facility] for 2 years (but has noted "twitch" in the R eye occasionally)  -MRI brain has been negative.  -Patient had requested another opinion at [facility] neurology and Dr. Hassell Done put in a referral for that and patient has an appointment in just a few days on April 1.   3.  Hx of multiple PE's             -on lifelong coumadin  4.  Patient no longer needs to follow here.  She has neurology at [facility] and is following with neuro-ophthalmology.   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  Multiple records reviewed since last visit.  She has been to neuro-ophthalmology at [facility] on December 02, 2021.  Patient saw Dr. Hassell Done.  Notes  indicate that patient was on 500 mg twice per day when seen by Dr. Hassell Done and discs remained normal, as they had on all visits when he saw her.  He saw her back in December, 2023 and her exam continued to be stable.  Diamox was discontinued and she was to follow-up with them in 4 months.  Noting that she continued to complain about right hemifacial spasm.  Patient requested a consultation with neurology at [facility]. Diamox: 500 mg 2 in the AM, 1 in the PM  CURRENT MEDICATIONS:  Outpatient Encounter Medications as of 11/08/2021  Medication Sig   acetaZOLAMIDE ER (DIAMOX) 500 MG capsule TAKE 2 CAPSULES IN THE MORNING AND 1 CAPSULE IN THE EVENING (Patient taking differently: TAKE 1 CAPSULES IN THE MORNING AND 1 CAPSULE IN THE EVENING)   Ascorbic Acid (VITAMIN C) 1000  MG tablet Take 1,000 mg by mouth daily.   Cholecalciferol (DIALYVITE VITAMIN D 5000 PO) Take 5,000 Units by mouth daily.   Potassium Chloride ER 20 MEQ TBCR Take 20 mEq by mouth daily.   vitamin B-12 (CYANOCOBALAMIN) 1000 MCG tablet Take 1,000 mcg by mouth daily.   warfarin (COUMADIN) 3 MG tablet 9mg  on Mon, Wed, Fri, and Sat. 7.5mg  on Tues, Thurs, and Sun.   No facility-administered encounter medications on file as of 11/08/2021.     Objective:   PHYSICAL EXAMINATION:    VITALS:   Vitals:   11/08/21 0929  BP: 122/83  Pulse: 77  SpO2: 99%  Weight: 230 lb 6.4 oz (104.5 kg)  Height: 5\' 5"  (1.651 m)   Wt Readings from Last 3 Encounters:  11/08/21 230 lb 6.4 oz (104.5 kg)  11/01/21 232 lb (105.2 kg)  08/30/21 232 lb 8 oz (105.5 kg)        GEN:  Normal appears female in no acute distress.  Appears stated age. HEENT:  Normocephalic, atraumatic. The mucous membranes are moist. The superficial temporal arteries are without ropiness or tenderness. Cardiovascular: Regular rate and rhythm. Lungs: Clear to auscultation bilaterally. Neck/Heme: There are no carotid bruits noted bilaterally.   NEUROLOGICAL: Orientation:  The patient is alert and oriented x 3.   Cranial nerves: NL folds equal at rest, but she does have decreased nasolabial fold on the right with smile.  She has asymmetric blink.  She has decreased forehead wrinkle on the right.  She has a single fasciculation at the right orbicularis oculi, medial, lower lid. she has mild ptosis on the L. Fundoscopic attempted but disc margins look good on the L, not well visualized on R.  Extraocular muscles are intact and visual fields are full to confrontational testing. Speech is fluent and clear. Soft palate rises symmetrically and there is no tongue deviation. Hearing is intact to conversational tone. Tone: Tone is good throughout. Sensation: Sensation is intact to light touch x 4 Coordination:  The patient has no difficulty with  RAM's or FNF bilaterally. Motor: Strength is 5/5 in the bilateral upper and lower extremities.  Shoulder shrug is equal and symmetric. There is no pronator drift.  There are no fasciculations noted with the exception of a single fasciculation at the right orbicularis oris oculi, described above.  I have reviewed and interpreted the following labs independently   Chemistry      Component Value Date/Time   NA 139 02/16/2021 1025   K 3.4 (L) 02/16/2021 1025   CL 111 02/16/2021 1025   CO2 22 02/16/2021 1025   BUN 13 02/16/2021 1025   CREATININE 0.81  02/16/2021 1025      Component Value Date/Time   CALCIUM 9.0 02/16/2021 1025   ALKPHOS 88 02/16/2021 1025   AST 20 02/16/2021 1025   ALT 23 02/16/2021 1025   BILITOT 0.4 02/16/2021 1025        Total time spent on today's visit was *** minutes, including both face-to-face time and nonface-to-face time.  Time included that spent on review of records (prior notes available to me/labs/imaging if pertinent), discussing treatment and goals, answering patient's questions and coordinating care.  Cc:  Nche, Charlene Brooke, NP

## 2022-05-11 ENCOUNTER — Encounter: Payer: Self-pay | Admitting: Neurology

## 2022-05-11 ENCOUNTER — Ambulatory Visit: Payer: Managed Care, Other (non HMO) | Admitting: Neurology

## 2022-05-11 VITALS — BP 147/88 | HR 72 | Ht 65.0 in | Wt 231.0 lb

## 2022-05-11 DIAGNOSIS — G5131 Clonic hemifacial spasm, right: Secondary | ICD-10-CM | POA: Diagnosis not present

## 2022-05-11 DIAGNOSIS — G4733 Obstructive sleep apnea (adult) (pediatric): Secondary | ICD-10-CM | POA: Diagnosis not present

## 2022-05-11 NOTE — Patient Instructions (Addendum)
Good to see you!  The physicians and staff at [facility] are committed to providing excellent care. You may receive a survey requesting feedback about your experience at our office. We strive to receive "very good" responses to the survey questions. If you feel that your experience would prevent you from giving the office a "very good " response, please contact our office to try to remedy the situation. We may be reached at [phone]. Thank you for taking the time out of your busy day to complete the survey.  

## 2022-07-01 ENCOUNTER — Other Ambulatory Visit: Payer: Self-pay | Admitting: Nurse Practitioner

## 2022-07-01 DIAGNOSIS — Z86711 Personal history of pulmonary embolism: Secondary | ICD-10-CM

## 2022-07-18 ENCOUNTER — Other Ambulatory Visit: Payer: Self-pay | Admitting: Nurse Practitioner

## 2022-07-18 DIAGNOSIS — E876 Hypokalemia: Secondary | ICD-10-CM

## 2022-07-18 NOTE — Telephone Encounter (Signed)
Medication was discontinued

## 2022-07-25 ENCOUNTER — Encounter: Payer: Self-pay | Admitting: Nurse Practitioner

## 2022-07-25 DIAGNOSIS — G932 Benign intracranial hypertension: Secondary | ICD-10-CM

## 2022-07-28 IMAGING — MR MR HEAD WO/W CM
14 series · 48 of 48 positions shown · IV contrast (multihance)
Comparison: Head CT 09/23/2018

CLINICAL DATA: Papilledema.  Ptosis of left eyelid.

EXAM:
MRI HEAD WITHOUT AND WITH CONTRAST
TECHNIQUE: Multiplanar, multiecho pulse sequences of the brain and surrounding
structures were obtained without and with intravenous contrast.
CONTRAST:  20mL MULTIHANCE GADOBENATE DIMEGLUMINE 529 MG/ML IV SOLN

[Series 5: T1 · sagittal · 4.0mm · 0.75mm/px · 1 of 31 slices shown (1 of 3)]
[im 1/31]
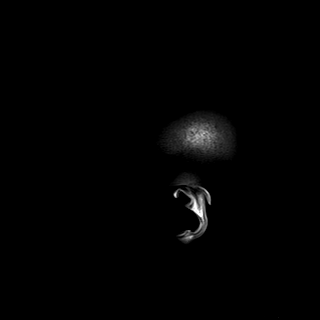

[Series 6: DWI · axial · 3.0mm · 0.94mm/px · z∈[-61,+77]mm · 8 of 160 slices shown (1 of 3)]
[im 1/160]
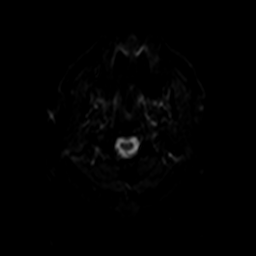
[im 23/160]
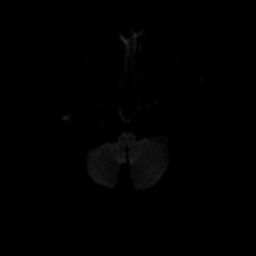
[im 46/160]
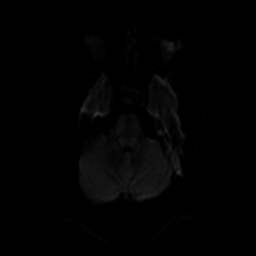
[im 69/160]
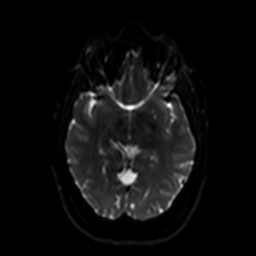
[im 91/160]
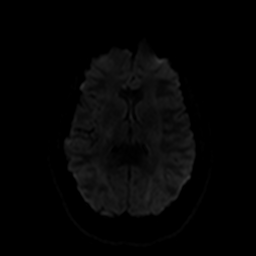
[im 114/160]
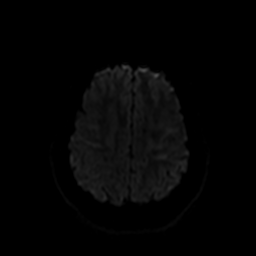
[im 137/160]
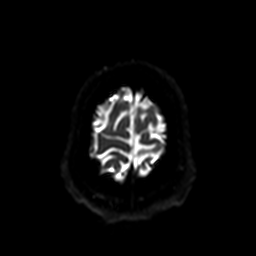
[im 160/160]
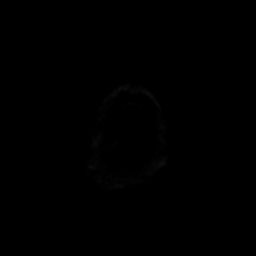

[Series 7: ax dwi_tracew · axial · 3.0mm · 0.94mm/px · z∈[-61,+77]mm · 4 of 80 slices shown]
[im 1/80]
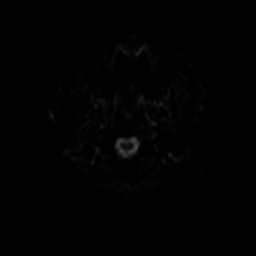
[im 27/80]
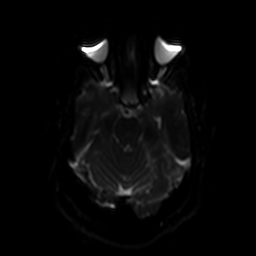
[im 53/80]
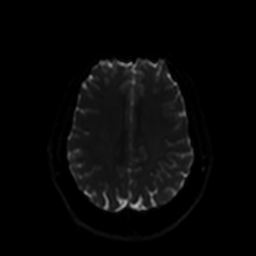
[im 80/80]
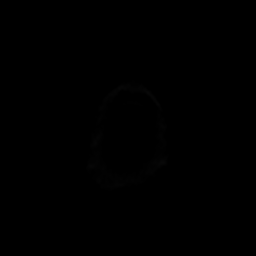

[Series 8: ax dwi_adc · axial · 3.0mm · 0.94mm/px · z∈[-61,+77]mm · 2 of 40 slices shown]
[im 1/40]
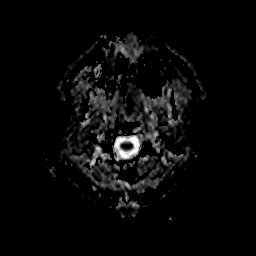
[im 40/40]
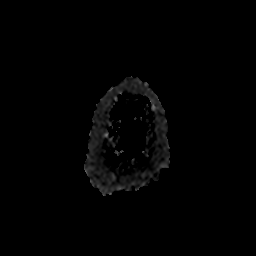

[Series 9: DWI · coronal · 5.0mm · 1.44mm/px · 4 of 70 slices shown (2 of 3)]
[im 1/70]
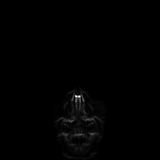
[im 24/70]
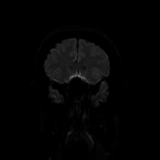
[im 47/70]
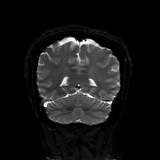
[im 70/70]
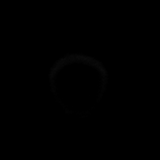

[Series 10: DWI · coronal · 5.0mm · 1.44mm/px · 2 of 35 slices shown (3 of 3)]
[im 1/35]
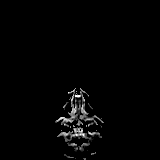
[im 35/35]
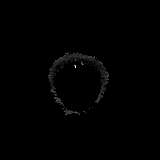

[Series 11: T2 · axial · 4.0mm · 0.36mm/px · 1 of 28 slices shown]
[im 1/28]
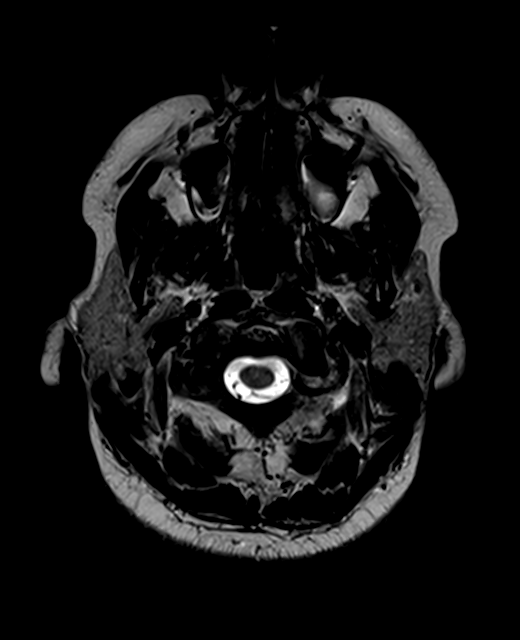

[Series 12: FLAIR · axial · 3.0mm · 0.72mm/px · 1 of 26 slices shown]
[im 1/26]
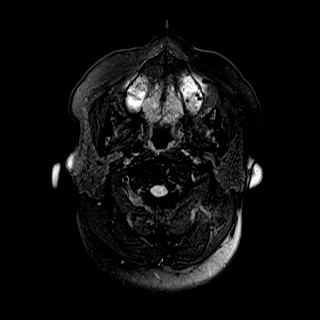

[Series 13: swi_images · axial · 1.5mm · 0.90mm/px · z∈[-63,+78]mm · 5 of 96 slices shown]
[im 1/96]
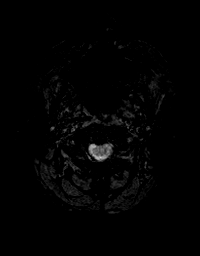
[im 24/96]
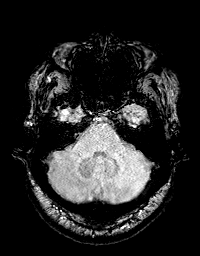
[im 48/96]
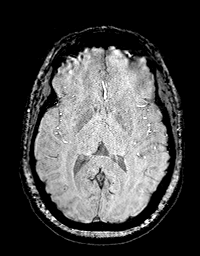
[im 72/96]
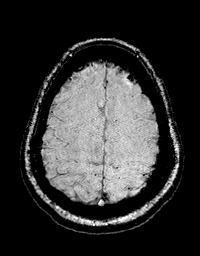
[im 96/96]
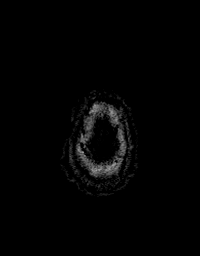

[Series 15: T1 · axial · 1.0mm · 0.94mm/px · z∈[-59,+83]mm · 7 of 144 slices shown (2 of 3)]
[im 1/144]
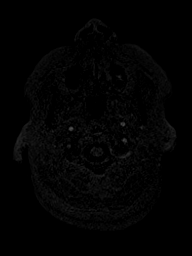
[im 24/144]
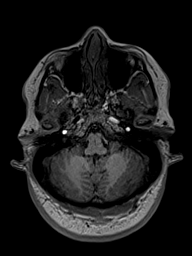
[im 48/144]
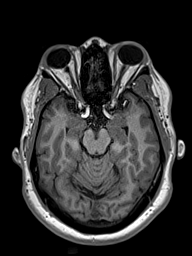
[im 72/144]
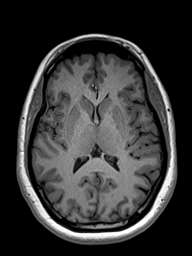
[im 96/144]
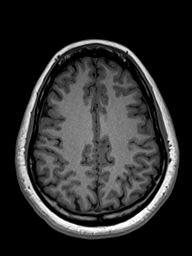
[im 120/144]
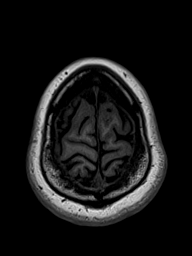
[im 144/144]
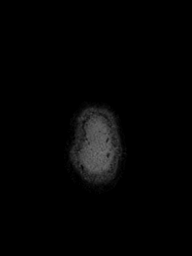

[Series 20: T2 post-contrast · coronal · 4.0mm · 0.36mm/px · 2 of 37 slices shown]
[im 1/37]
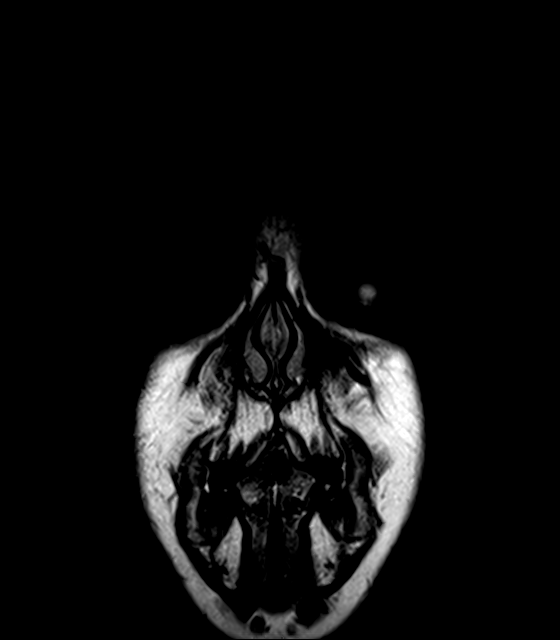
[im 37/37]
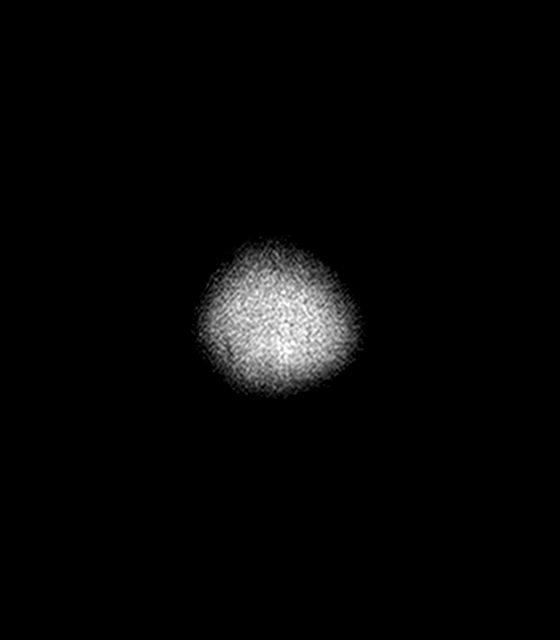

[Series 21: T1 · axial · 1.0mm · 0.94mm/px · z∈[-67,+74]mm · 7 of 144 slices shown (3 of 3)]
[im 1/144]
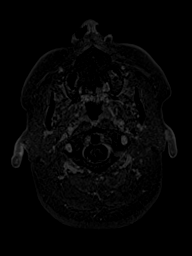
[im 24/144]
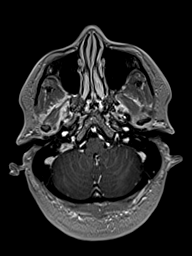
[im 48/144]
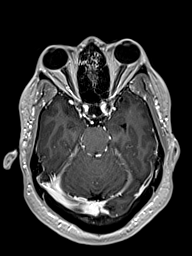
[im 72/144]
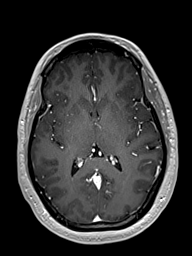
[im 96/144]
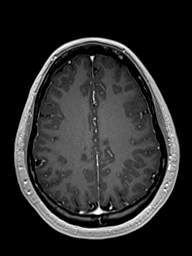
[im 120/144]
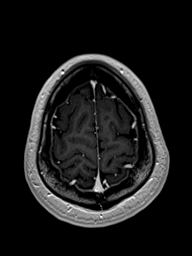
[im 144/144]
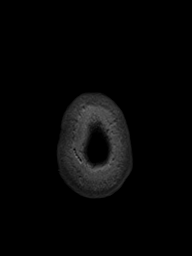

[Series 22: T1 post-contrast · coronal · 4.0mm · 0.72mm/px · 2 of 37 slices shown (1 of 2)]
[im 1/37]
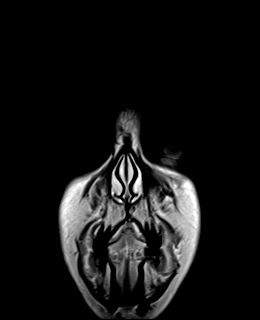
[im 37/37]
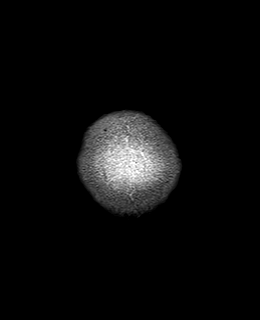

[Series 23: T1 post-contrast · sagittal · 4.0mm · 0.75mm/px · 2 of 31 slices shown (2 of 2)]
[im 1/31]
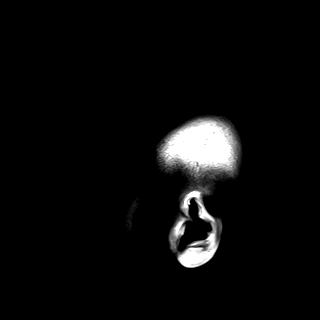
[im 31/31]
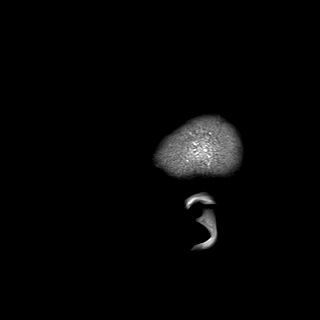

[48 of 48 positions shown; findings below may reference images not displayed]

FINDINGS: Brain: There is no evidence of an acute infarct, intracranial
hemorrhage, mass, midline shift, or extra-axial fluid collection.
There is a prominently enlarged partially empty sella. There is no
significant cerebellar tonsillar ectopia. The ventricles and sulci
are normal. The brain is normal in signal. No abnormal enhancement
is identified.

Vascular: Major intracranial vascular flow voids are preserved. The
major dural venous sinuses are enhancing.

Skull and upper cervical spine: Unremarkable bone marrow signal.

Sinuses/Orbits: Unremarkable orbits. No significant inflammatory
changes in the paranasal sinuses or mastoid air cells.

Other: None.
IMPRESSION: 1. Partially empty sella, often an incidental finding though can be
seen with idiopathic intracranial hypertension.
2. Otherwise unremarkable appearance of the brain.

## 2022-08-02 ENCOUNTER — Other Ambulatory Visit (INDEPENDENT_AMBULATORY_CARE_PROVIDER_SITE_OTHER): Payer: Managed Care, Other (non HMO)

## 2022-08-02 DIAGNOSIS — G932 Benign intracranial hypertension: Secondary | ICD-10-CM

## 2022-08-02 LAB — BASIC METABOLIC PANEL
BUN: 14 mg/dL (ref 6–23)
CO2: 23 mEq/L (ref 19–32)
Calcium: 9 mg/dL (ref 8.4–10.5)
Chloride: 110 mEq/L (ref 96–112)
Creatinine, Ser: 0.8 mg/dL (ref 0.40–1.20)
GFR: 88.44 mL/min (ref >60.00)
Glucose, Bld: 98 mg/dL (ref 70–99)
Potassium: 3.8 mEq/L (ref 3.5–5.1)
Sodium: 140 mEq/L (ref 135–145)

## 2022-09-05 ENCOUNTER — Ambulatory Visit: Payer: Managed Care, Other (non HMO) | Admitting: Neurology

## 2022-09-05 ENCOUNTER — Encounter: Payer: Self-pay | Admitting: Neurology

## 2022-09-05 NOTE — Progress Notes (Deleted)
Patient: Theresa Riley Date of Birth: 1975/05/04  Reason for Visit: Follow up History from: Patient Primary Neurologist: Dohmeier   ASSESSMENT AND PLAN 47 y.o. year old female   1.  OSA  -HST May 2023 severe sleep apnea with an unusual distribution of non-REM sleep apnea being more frequent than REM sleep apnea    HISTORY OF PRESENT ILLNESS: Today 09/05/22 Here for CPAP follow-up.  When last seen pressure was increased 7-18 cmH2O.  HISTORY  08/30/21 Dr. Vickey Huger: Judi Jaffe is a 47 y.o.  African American female patient seen here in a RV on 08/30/2021, The patient underwent a home sleep test on 5-3 2023 based on a history of blood clotting disorder pulmonary emboli and suspicion of OSA.  She had a sleep study in 2021 in [place] diagnosed in [place] with severe sleep apnea was diagnosed with a REM AHI of 42.1.  Epworth sleepiness score was endorsed at 13 points.   06-15-2021: The watch pat home sleep test confirmed an AHI of 32/h interestingly her non-REM sleep apnea here was higher than her REM sleep apnea.   There was also a significant positional dependency.  The patient slept mainly in supine position where she reached an AHI apnea hypopnea index of 50.7/h when she slept on her right side her AHI was 0.9/h.  She had 30 minutes of desaturation with a nadir of 77%.  And I ordered an auto titrating CPAP between 6 and 16 cm pressure window 3 cm expiratory pressure relief at the interface of patient's choice.  The patient presents with her new machine and is highly compliant excellent compliance of 100% by days and hours average user time 7 hours 18 minutes.  Residual AHI is 1.0/h and the 95th percentile pressure is 15.5 cm.  Minimal air leakage which means that the interface is fitting her very well.     She feels refreshed in AM, and her Epworth score is now 5 / 24 points - down from 13/ 24 points.  Nocturia 4-5 times is now reduced to one time at night.   REVIEW OF SYSTEMS:  Out of a complete 14 system review of symptoms, the patient complains only of the following symptoms, and all other reviewed systems are negative.  See HPI  ALLERGIES: No Known Allergies  HOME MEDICATIONS: Outpatient Medications Prior to Visit  Medication Sig Dispense Refill   Ascorbic Acid (VITAMIN C) 1000 MG tablet Take 1,000 mg by mouth daily.     Cholecalciferol (DIALYVITE VITAMIN D 5000 PO) Take 5,000 Units by mouth daily.     omeprazole (PRILOSEC) 20 MG capsule Take 1 capsule (20 mg total) by mouth daily. 30 capsule 0   vitamin B-12 (CYANOCOBALAMIN) 1000 MCG tablet Take 1,000 mcg by mouth daily.     warfarin (COUMADIN) 3 MG tablet TAKE 3 TABS ON MON, WED, FRI, AND SAT. 2 & 1/2 TAB ON TUES, THURS, AND SUN. 234 tablet 1   No facility-administered medications prior to visit.    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   Pulmonary emboli (HCC)    Sleep apnea    Yeast infection     PAST SURGICAL HISTORY: Past Surgical History:  Procedure Laterality Date   WISDOM TOOTH EXTRACTION     x4    FAMILY HISTORY: Family History  Problem Relation Age of Onset   Pulmonary embolism Brother    Deep  vein thrombosis Brother     SOCIAL HISTORY: Social History   Socioeconomic History   Marital status: Single    Spouse name: Not on file   Number of children: 2   Years of education: Not on file   Highest education level: Not on file  Occupational History   Not on file  Tobacco Use   Smoking status: Never   Smokeless tobacco: Never  Vaping Use   Vaping status: Never Used  Substance and Sexual Activity   Alcohol use: Never   Drug use: Never   Sexual activity: Not Currently    Birth control/protection: Abstinence, None  Other Topics Concern   Not on file  Social History Narrative   No caffeine use   Right Handed    Lives in a two story home    Social  Determinants of Health   Financial Resource Strain: Not on file  Food Insecurity: Not on file  Transportation Needs: Not on file  Physical Activity: Not on file  Stress: Not on file  Social Connections: Not on file  Intimate Partner Violence: Not on file    PHYSICAL EXAM  There were no vitals filed for this visit. There is no height or weight on file to calculate BMI.  Generalized: Well developed, in no acute distress  Neurological examination  Mentation: Alert oriented to time, place, history taking. Follows all commands speech and language fluent Cranial nerve II-XII: Pupils were equal round reactive to light. Extraocular movements were full, visual field were full on confrontational test. Facial sensation and strength were normal. Uvula tongue midline. Head turning and shoulder shrug  were normal and symmetric. Motor: The motor testing reveals 5 over 5 strength of all 4 extremities. Good symmetric motor tone is noted throughout.  Sensory: Sensory testing is intact to soft touch on all 4 extremities. No evidence of extinction is noted.  Coordination: Cerebellar testing reveals good finger-nose-finger and heel-to-shin bilaterally.  Gait and station: Gait is normal. Tandem gait is normal. Romberg is negative. No drift is seen.  Reflexes: Deep tendon reflexes are symmetric and normal bilaterally.   DIAGNOSTIC DATA (LABS, IMAGING, TESTING) - I reviewed patient records, labs, notes, testing and imaging myself where available.  Lab Results  Component Value Date   WBC 3.5 (L) 02/16/2021   HGB 13.1 02/16/2021   HCT 40.4 02/16/2021   MCV 97.3 02/16/2021   PLT 259.0 02/16/2021      Component Value Date/Time   NA 140 08/02/2022 1329   K 3.8 08/02/2022 1329   CL 110 08/02/2022 1329   CO2 23 08/02/2022 1329   GLUCOSE 98 08/02/2022 1329   BUN 14 08/02/2022 1329   CREATININE 0.80 08/02/2022 1329   CALCIUM 9.0 08/02/2022 1329   PROT 6.8 03/23/2022 1210   ALBUMIN 4.1 03/23/2022 1210    AST 19 03/23/2022 1210   ALT 21 03/23/2022 1210   ALKPHOS 100 03/23/2022 1210   BILITOT 0.4 03/23/2022 1210   GFRNONAA >60 12/19/2019 1343   GFRAA >60 09/23/2018 1203   Lab Results  Component Value Date   CHOL 158 03/23/2022   HDL 34.70 (L) 03/23/2022   LDLCALC 107 (H) 03/23/2022   TRIG 81.0 03/23/2022   CHOLHDL 5 03/23/2022   No results found for: "HGBA1C" No results found for: "VITAMINB12" Lab Results  Component Value Date   TSH 1.00 02/16/2021    Margie Ege, AGNP-C, DNP 09/05/2022, 5:26 AM [facility] [address], [address] [place], [place] [address] [phone]

## 2022-11-13 NOTE — Progress Notes (Unsigned)
Patient: Theresa Riley Date of Birth: 13-Jun-1975  Reason for Visit: Follow up History from: Patient Primary Neurologist:    ASSESSMENT AND PLAN 47 y.o. year old female    HISTORY OF PRESENT ILLNESS: Today 11/13/22  HISTORY  Theresa Riley is a 47 y.o.  African American female patient seen here in a RV on 08/30/2021, The patient underwent a home sleep test on 5-3 2023 based on a history of blood clotting disorder pulmonary emboli and suspicion of OSA.  She had a sleep study in 2021 in [place] diagnosed in [place] with severe sleep apnea was diagnosed with a REM AHI of 42.1.  Epworth sleepiness score was endorsed at 13 points.   06-15-2021: The watch pat home sleep test confirmed an AHI of 32/h interestingly her non-REM sleep apnea here was higher than her REM sleep apnea.   There was also a significant positional dependency.  The patient slept mainly in supine position where she reached an AHI apnea hypopnea index of 50.7/h when she slept on her right side her AHI was 0.9/h.  She had 30 minutes of desaturation with a nadir of 77%.  And I ordered an auto titrating CPAP between 6 and 16 cm pressure window 3 cm expiratory pressure relief at the interface of patient's choice.  The patient presents with her new machine and is highly compliant excellent compliance of 100% by days and hours average user time 7 hours 18 minutes.  Residual AHI is 1.0/h and the 95th percentile pressure is 15.5 cm.  Minimal air leakage which means that the interface is fitting her very well.     She feels refreshed in AM, and her Epworth score is now 5 / 24 points - down from 13/ 24 points.  Nocturia 4-5 times is now reduced to one time at night.        Original referral from NP Nche for a Sleep Consultation, MD.  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  She  has a  past medical history of Pulmonary emboli (2014- 2017 / on warfarin)HCC) and Yeast infection.     The patient had the first sleep study in the year 2021. Internal referral for OSA. Pt just moved from [facility] in 2021. Had previously sleep study around 2021.  Dx with severe sleep apnea at that time. Was not able to complete titration study d/t insurance issues, never set up with CPAP.    Outside PSG from [place] , [place]: [phone];  The patient was seen at the [facility] for sleep disorders, referring physician was Dr. Michaela Corner.  She was diagnosed with an overall AHI of 20.9 and REM AHI of 42.1/h non-REM sleep AHI was 14.8/h.  There were no significant periodic limb movements or snoring arousals.  She had 1 central apnea all other events were obstructive but the vast majority was hypopnea.  Heart rate was in normal variability, she slept supine only.  Her hypnogram shows 3 distinct REM sleep cycles, in rem sleep she had 14 desaturations per hour of REM sleep.  Nadir was 74%.   Sleep relevant medical history: Nocturia 3-5 times at night- fragmented sleep. "Every time I roll over" .has enuresis.  No TBI no ENT surgery. Wisdom teeth 4 removed.    Family medical /sleep history: mother is likely undiagnosed,  no other family member  on CPAP with OSA.  Social history:  Patient is working as Public affairs consultant,  [employer]  2004 graduate, and lives in a household with  mother.  Family status is single.  The patient currently works full time, part from home, Tobacco use;none .  ETOH use ; rare ,  Caffeine intake ; none      Sleep habits are as follows: The patient's dinner time is between 8-9 PM. The patient goes to bed at 11-12 PM and continues to sleep for intervals 1-2 hours, wakes for many, many bathroom breaks.   The preferred sleep position is supine but she wakes up snoring, choking, , with the support of 1-pillow. Dreams are reportedly frequent/vivid.  7.30 AM is the usual rise time. The patient  wakes up spontaneously with an alarm at 6.00.  She reports sometimes  not feeling refreshed or restored in AM. Naps are taken frequently, dozing off in meetings.   REVIEW OF SYSTEMS: Out of a complete 14 system review of symptoms, the patient complains only of the following symptoms, and all other reviewed systems are negative.  See HPI  ALLERGIES: No Known Allergies  HOME MEDICATIONS: Outpatient Medications Prior to Visit  Medication Sig Dispense Refill   Ascorbic Acid (VITAMIN C) 1000 MG tablet Take 1,000 mg by mouth daily.     Cholecalciferol (DIALYVITE VITAMIN D 5000 PO) Take 5,000 Units by mouth daily.     omeprazole (PRILOSEC) 20 MG capsule Take 1 capsule (20 mg total) by mouth daily. 30 capsule 0   vitamin B-12 (CYANOCOBALAMIN) 1000 MCG tablet Take 1,000 mcg by mouth daily.     warfarin (COUMADIN) 3 MG tablet TAKE 3 TABS ON MON, WED, FRI, AND SAT. 2 & 1/2 TAB ON TUES, THURS, AND SUN. 234 tablet 1   No facility-administered medications prior to visit.    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   Pulmonary emboli (HCC)    Sleep apnea    Yeast infection     PAST SURGICAL HISTORY: Past Surgical History:  Procedure Laterality Date   WISDOM TOOTH EXTRACTION     x4    FAMILY HISTORY: Family History  Problem Relation Age of Onset   Pulmonary embolism Brother    Deep vein thrombosis Brother     SOCIAL HISTORY: Social History   Socioeconomic History   Marital status: Single    Spouse name: Not on file   Number of children: 2   Years of education: Not on file   Highest education level: Not on file  Occupational History   Not on file  Tobacco Use   Smoking status: Never   Smokeless tobacco: Never  Vaping Use   Vaping status: Never Used  Substance and Sexual Activity   Alcohol use: Never   Drug use: Never   Sexual activity: Not Currently    Birth  control/protection: Abstinence, None  Other Topics Concern   Not on file  Social History Narrative   No caffeine use   Right Handed    Lives in a two story home    Social Determinants of Health   Financial Resource Strain: Not on file  Food Insecurity: Not on file  Transportation Needs: Not on file  Physical Activity: Not on file  Stress: Not on file  Social Connections: Not on file  Intimate Partner Violence: Not on file    PHYSICAL EXAM  There were no vitals filed for this visit. There is no height or weight on file to calculate BMI.  Generalized: Well developed, in no acute distress  Neurological examination  Mentation: Alert oriented to time, place, history taking. Follows all commands speech and language fluent Cranial nerve II-XII: Pupils were equal round reactive to light. Extraocular movements were full, visual field were full on confrontational test. Facial sensation and strength were normal. Uvula tongue midline. Head turning and shoulder shrug  were normal and symmetric. Motor: The motor testing reveals 5 over 5 strength of all 4 extremities. Good symmetric motor tone is noted throughout.  Sensory: Sensory testing is intact to soft touch on all 4 extremities. No evidence of extinction is noted.  Coordination: Cerebellar testing reveals good finger-nose-finger and heel-to-shin bilaterally.  Gait and station: Gait is normal. Tandem gait is normal. Romberg is negative. No drift is seen.  Reflexes: Deep tendon reflexes are symmetric and normal bilaterally.   DIAGNOSTIC DATA (LABS, IMAGING, TESTING) - I reviewed patient records, labs, notes, testing and imaging myself where available.  Lab Results  Component Value Date   WBC 3.5 (L) 02/16/2021   HGB 13.1 02/16/2021   HCT 40.4 02/16/2021   MCV 97.3 02/16/2021   PLT 259.0 02/16/2021      Component Value Date/Time   NA 140 08/02/2022 1329   K 3.8 08/02/2022 1329   CL 110 08/02/2022 1329   CO2 23 08/02/2022 1329    GLUCOSE 98 08/02/2022 1329   BUN 14 08/02/2022 1329   CREATININE 0.80 08/02/2022 1329   CALCIUM 9.0 08/02/2022 1329   PROT 6.8 03/23/2022 1210   ALBUMIN 4.1 03/23/2022 1210   AST 19 03/23/2022 1210   ALT 21 03/23/2022 1210   ALKPHOS 100 03/23/2022 1210   BILITOT 0.4 03/23/2022 1210   GFRNONAA >60 12/19/2019 1343   GFRAA >60 09/23/2018 1203   Lab Results  Component Value Date   CHOL 158 03/23/2022   HDL 34.70 (L) 03/23/2022   LDLCALC 107 (H) 03/23/2022   TRIG 81.0 03/23/2022   CHOLHDL 5 03/23/2022   No results found for: "HGBA1C" No results found for: "VITAMINB12" Lab Results  Component Value Date   TSH 1.00 02/16/2021    Margie Ege, AGNP-C, DNP 11/13/2022, 9:19 PM [facility] [address], [address] [place], [place] [address] [phone]

## 2022-11-14 ENCOUNTER — Ambulatory Visit: Payer: Managed Care, Other (non HMO) | Admitting: Neurology

## 2022-11-14 ENCOUNTER — Encounter: Payer: Self-pay | Admitting: Neurology

## 2022-11-14 ENCOUNTER — Telehealth: Payer: Self-pay

## 2022-11-14 VITALS — BP 156/91 | HR 77 | Ht 65.0 in | Wt 228.4 lb

## 2022-11-14 DIAGNOSIS — G4733 Obstructive sleep apnea (adult) (pediatric): Secondary | ICD-10-CM

## 2022-11-14 NOTE — Telephone Encounter (Signed)
Community message sent via Epic to [facility] for CPAP orders

## 2022-11-14 NOTE — Patient Instructions (Signed)
Continue nightly CPAP usage.  Recommend minimum of 4 hours nightly utilization.  Continue current settings.  Will send an order to DME to send supplies as needed.

## 2022-11-20 ENCOUNTER — Telehealth: Payer: Self-pay | Admitting: Nurse Practitioner

## 2022-11-20 NOTE — Telephone Encounter (Signed)
When dialint the phone # use option 3

## 2022-11-20 NOTE — Telephone Encounter (Signed)
Boneta Lucks from [place] [phone] called and said the pt INR from oct 5th was 1.9

## 2022-11-23 ENCOUNTER — Encounter: Payer: Self-pay | Admitting: Nurse Practitioner

## 2022-11-23 ENCOUNTER — Telehealth: Payer: Self-pay | Admitting: Nurse Practitioner

## 2022-11-23 NOTE — Telephone Encounter (Signed)
-----   Message from [facility] Adelanto M sent at 11/20/2022 11:50 AM EDT -----  ----- Message ----- From: Merrie Roof D Sent: 11/20/2022  10:13 AM EDT To: Takila Kronberg Team  Please abstract and route to provider.

## 2022-12-21 ENCOUNTER — Telehealth: Payer: Self-pay | Admitting: Nurse Practitioner

## 2022-12-21 DIAGNOSIS — Z86711 Personal history of pulmonary embolism: Secondary | ICD-10-CM

## 2022-12-21 MED ORDER — WARFARIN SODIUM 3 MG PO TABS: ORAL_TABLET | ORAL | 1 refills | Status: DC

## 2022-12-21 NOTE — Telephone Encounter (Signed)
Patient was called and notified. PT verbalized understanding.

## 2022-12-21 NOTE — Telephone Encounter (Signed)
Refill request Pharmacy change warfarin (COUMADIN) 3 MG tablet [[id number]]   Pharmacy change to [facility] [address] [place] [address]

## 2022-12-26 ENCOUNTER — Other Ambulatory Visit: Payer: Self-pay | Admitting: Nurse Practitioner

## 2022-12-26 DIAGNOSIS — Z1231 Encounter for screening mammogram for malignant neoplasm of breast: Secondary | ICD-10-CM

## 2023-01-18 ENCOUNTER — Telehealth: Payer: Self-pay | Admitting: Nurse Practitioner

## 2023-01-18 DIAGNOSIS — Z7901 Long term (current) use of anticoagulants: Secondary | ICD-10-CM

## 2023-01-18 NOTE — Telephone Encounter (Signed)
-----   Message from [facility] Ashlee G sent at 01/18/2023 11:03 AM EST ----- Regarding: PT/INR RESULTS  ----- Message ----- From: Merrie Roof D Sent: 01/18/2023   8:53 AM EST To: Sherian Valenza Team  Please abstract and route to provider.

## 2023-01-18 NOTE — Telephone Encounter (Signed)
 Called patient no answer left VM for call back.

## 2023-01-25 ENCOUNTER — Ambulatory Visit
Admission: RE | Admit: 2023-01-25 | Discharge: 2023-01-25 | Disposition: A | Discharge status: Home / Self Care | Payer: Managed Care, Other (non HMO) | Source: Ambulatory Visit | Attending: Nurse Practitioner | Admitting: Nurse Practitioner

## 2023-01-25 ENCOUNTER — Other Ambulatory Visit (INDEPENDENT_AMBULATORY_CARE_PROVIDER_SITE_OTHER): Payer: Managed Care, Other (non HMO)

## 2023-01-25 DIAGNOSIS — Z7901 Long term (current) use of anticoagulants: Secondary | ICD-10-CM | POA: Diagnosis not present

## 2023-01-25 DIAGNOSIS — Z1231 Encounter for screening mammogram for malignant neoplasm of breast: Secondary | ICD-10-CM

## 2023-01-25 LAB — CBC
HCT: 36 % (ref 36.0–46.0)
Hemoglobin: 11.9 g/dL — ABNORMAL LOW (ref 12.0–15.0)
MCHC: 33 g/dL (ref 30.0–36.0)
MCV: 99.2 fL (ref 78.0–100.0)
Platelets: 271 10*3/uL (ref 150.0–400.0)
RBC: 3.63 Mil/uL — ABNORMAL LOW (ref 3.87–5.11)
RDW: 14.3 % (ref 11.5–15.5)
WBC: 5.4 10*3/uL (ref 4.0–10.5)

## 2023-03-12 ENCOUNTER — Encounter: Payer: Self-pay | Admitting: Nurse Practitioner

## 2023-03-26 ENCOUNTER — Ambulatory Visit (INDEPENDENT_AMBULATORY_CARE_PROVIDER_SITE_OTHER): Payer: Managed Care, Other (non HMO) | Admitting: Nurse Practitioner

## 2023-03-26 ENCOUNTER — Encounter: Payer: Self-pay | Admitting: Nurse Practitioner

## 2023-03-26 VITALS — BP 118/88 | HR 55 | Temp 98.3°F | Ht 65.0 in | Wt 233.2 lb

## 2023-03-26 DIAGNOSIS — Z0001 Encounter for general adult medical examination with abnormal findings: Secondary | ICD-10-CM

## 2023-03-26 DIAGNOSIS — Z0000 Encounter for general adult medical examination without abnormal findings: Secondary | ICD-10-CM | POA: Diagnosis not present

## 2023-03-26 DIAGNOSIS — E7800 Pure hypercholesterolemia, unspecified: Secondary | ICD-10-CM

## 2023-03-26 DIAGNOSIS — Z6838 Body mass index (BMI) 38.0-38.9, adult: Secondary | ICD-10-CM

## 2023-03-26 DIAGNOSIS — E66812 Obesity, class 2: Secondary | ICD-10-CM

## 2023-03-26 LAB — CBC
HCT: 37.8 % (ref 36.0–46.0)
Hemoglobin: 12.2 g/dL (ref 12.0–15.0)
MCHC: 32.3 g/dL (ref 30.0–36.0)
MCV: 98.9 fL (ref 78.0–100.0)
Platelets: 280 10*3/uL (ref 150.0–400.0)
RBC: 3.82 Mil/uL — ABNORMAL LOW (ref 3.87–5.11)
RDW: 14.2 % (ref 11.5–15.5)
WBC: 4.2 10*3/uL (ref 4.0–10.5)

## 2023-03-26 LAB — LIPID PANEL
Cholesterol: 152 mg/dL (ref 0–200)
HDL: 42.9 mg/dL (ref >39.00)
LDL Cholesterol: 93 mg/dL (ref 0–99)
NonHDL: 109.49
Total CHOL/HDL Ratio: 4
Triglycerides: 84 mg/dL (ref 0.0–149.0)
VLDL: 16.8 mg/dL (ref 0.0–40.0)

## 2023-03-26 LAB — COMPREHENSIVE METABOLIC PANEL
ALT: 16 U/L (ref 0–35)
AST: 19 U/L (ref 0–37)
Albumin: 4.2 g/dL (ref 3.5–5.2)
Alkaline Phosphatase: 91 U/L (ref 39–117)
BUN: 9 mg/dL (ref 6–23)
CO2: 25 meq/L (ref 19–32)
Calcium: 8.9 mg/dL (ref 8.4–10.5)
Chloride: 109 meq/L (ref 96–112)
Creatinine, Ser: 0.79 mg/dL (ref 0.40–1.20)
GFR: 89.38 mL/min (ref >60.00)
Glucose, Bld: 94 mg/dL (ref 70–99)
Potassium: 3.6 meq/L (ref 3.5–5.1)
Sodium: 141 meq/L (ref 135–145)
Total Bilirubin: 0.3 mg/dL (ref 0.2–1.2)
Total Protein: 7.4 g/dL (ref 6.0–8.3)

## 2023-03-26 LAB — TSH: TSH: 0.68 u[IU]/mL (ref 0.35–5.50)

## 2023-03-26 NOTE — Patient Instructions (Signed)
 Go to lab Maintain Heart healthy diet and daily exercise. Maintain current medications.

## 2023-03-26 NOTE — Progress Notes (Signed)
 Complete physical exam  Patient: Theresa Riley   DOB: [birth year]   48 y.o. Female  MRN: [id number] Visit Date: 03/26/2023  Subjective:    Chief Complaint  Patient presents with   Annual Exam    With fasting lab work, no concerns   Theresa Riley is a 48 y.o. female who presents today for a complete physical exam. She reports consuming a general diet.  No consistent exercise  She generally feels well. She reports sleeping well. She does not have additional problems to discuss today.  Vision:Yes Dental:Yes STD Screen:No  BP Readings from Last 3 Encounters:  03/26/23 118/88  11/14/22 (!) 156/91  05/11/22 (!) 147/88   Wt Readings from Last 3 Encounters:  03/26/23 233 lb 3.2 oz (105.8 kg)  11/14/22 228 lb 6.4 oz (103.6 kg)  05/11/22 231 lb (104.8 kg)   Most recent fall risk assessment:    03/26/2023   11:16 AM  Fall Risk   Falls in the past year? 0  Number falls in past yr: 0  Injury with Fall? 0  Risk for fall due to : No Fall Risks  Follow up Falls evaluation completed   Depression screen:Yes - No Depression  Most recent depression screenings:    03/26/2023   11:19 AM 03/23/2022   11:38 AM  PHQ 2/9 Scores  PHQ - 2 Score 0 0  PHQ- 9 Score 0    HPI  No problem-specific Assessment & Plan notes found for this encounter.   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   Pulmonary emboli (HCC)    Sleep apnea    Yeast infection    Past Surgical History:  Procedure Laterality Date   WISDOM TOOTH EXTRACTION     x4   Social History   Socioeconomic History   Marital status: Single    Spouse name: Not on file   Number of children: 2   Years of education: Not on file   Highest education level: Not on file  Occupational History   Not on file  Tobacco Use   Smoking status: Never   Smokeless tobacco: Never  Vaping Use   Vaping status: Never Used  Substance and Sexual Activity    Alcohol use: Never   Drug use: Never   Sexual activity: Not Currently    Birth control/protection: Abstinence  Other Topics Concern   Not on file  Social History Narrative   No caffeine use   Right Handed    Lives in a two story home    Social Drivers of Health   Financial Resource Strain: Not on file  Food Insecurity: Not on file  Transportation Needs: Not on file  Physical Activity: Not on file  Stress: Not on file  Social Connections: Not on file  Intimate Partner Violence: Not on file   Family Status  Relation Name Status   Mother  Alive   Father  Alive   Sister  Alive   Brother  Alive   MGM  Alive   MGF  Deceased   PGM  Deceased   PGF  Deceased  No partnership data on file   Family History  Problem Relation Age of Onset   Diabetes Mother    Dementia Mother    Seizures Mother    Pulmonary embolism Brother    Deep vein thrombosis Brother    No Known Allergies  Patient Care Team: Kandace Organ,  NP as PCP - General (Internal Medicine) Tat, Von Grumbling, DO as Consulting Physician (Neurology)   Medications: Outpatient Medications Prior to Visit  Medication Sig   acetaZOLAMIDE  ER (DIAMOX ) 500 MG capsule Take 1 capsule by mouth 2 (two) times daily.   Ascorbic Acid (VITAMIN C) 1000 MG tablet Take 1,000 mg by mouth daily.   Cholecalciferol (DIALYVITE VITAMIN D 5000 PO) Take 5,000 Units by mouth daily.   warfarin (COUMADIN ) 3 MG tablet TAKE 3 TABS ON MON, WED, FRI, AND SAT. 2 & 1/2 TAB ON TUES, THURS, AND SUN.   vitamin B-12 (CYANOCOBALAMIN) 1000 MCG tablet Take 1,000 mcg by mouth daily.   [DISCONTINUED] omeprazole  (PRILOSEC) 20 MG capsule Take 1 capsule (20 mg total) by mouth daily. (Patient not taking: Reported on 03/26/2023)   No facility-administered medications prior to visit.    Review of Systems  Constitutional:  Negative for activity change, appetite change and unexpected weight change.  Respiratory: Negative.    Cardiovascular: Negative.    Gastrointestinal: Negative.   Endocrine: Negative for cold intolerance and heat intolerance.  Genitourinary: Negative.   Musculoskeletal: Negative.   Skin: Negative.   Neurological: Negative.   Hematological: Negative.   Psychiatric/Behavioral:  Negative for behavioral problems, decreased concentration, dysphoric mood, hallucinations, self-injury, sleep disturbance and suicidal ideas. The patient is not nervous/anxious.        Objective:  BP 118/88 (BP Location: Left Arm, Patient Position: Sitting, Cuff Size: Normal)   Pulse (!) 55   Temp 98.3 F (36.8 C)   Ht 5\' 5"  (1.651 m)   Wt 233 lb 3.2 oz (105.8 kg)   LMP 02/14/2019   SpO2 100%   BMI 38.81 kg/m     Physical Exam Vitals and nursing note reviewed.  Constitutional:      General: She is not in acute distress. HENT:     Right Ear: Tympanic membrane, ear canal and external ear normal.     Left Ear: Tympanic membrane, ear canal and external ear normal.     Nose: Nose normal.  Eyes:     Extraocular Movements: Extraocular movements intact.     Conjunctiva/sclera: Conjunctivae normal.     Pupils: Pupils are equal, round, and reactive to light.  Neck:     Thyroid : No thyroid  mass, thyromegaly or thyroid  tenderness.  Cardiovascular:     Rate and Rhythm: Normal rate and regular rhythm.     Pulses: Normal pulses.     Heart sounds: Normal heart sounds.  Pulmonary:     Effort: Pulmonary effort is normal.     Breath sounds: Normal breath sounds.  Abdominal:     General: Bowel sounds are normal.     Palpations: Abdomen is soft.  Musculoskeletal:        General: Normal range of motion.     Cervical back: Normal range of motion and neck supple.     Right lower leg: No edema.     Left lower leg: No edema.  Lymphadenopathy:     Cervical: No cervical adenopathy.  Skin:    General: Skin is warm and dry.  Neurological:     Mental Status: She is alert and oriented to person, place, and time.     Cranial Nerves: No cranial  nerve deficit.  Psychiatric:        Mood and Affect: Mood normal.        Behavior: Behavior normal.        Thought Content: Thought content normal.     No  results found for any visits on 03/26/23.    Assessment & Plan:    Routine Health Maintenance and Physical Exam  Immunization History  Administered Date(s) Administered   PFIZER(Purple Top)SARS-COV-2 Vaccination 12/16/2019, 01/06/2020   Tdap 09/04/2007   Unspecified SARS-COV-2 Vaccination 05/04/2021   Health Maintenance  Topic Date Due   INFLUENZA VACCINE  05/14/2023 (Originally 09/14/2022)   COVID-19 Vaccine (3 - Pfizer risk series) 05/14/2023 (Originally 06/01/2021)   Hepatitis C Screening  03/25/2024 (Originally 04/17/1994)   Cervical Cancer Screening (HPV/Pap Cotest)  12/05/2023   Colonoscopy  04/26/2026   HIV Screening  Completed   HPV VACCINES  Aged Out   DTaP/Tdap/Td  Discontinued   Discussed health benefits of physical activity, and encouraged her to engage in regular exercise appropriate for her age and condition.  Problem List Items Addressed This Visit     Elevated LDL cholesterol level   Relevant Orders   Lipid panel   Obesity   Other Visit Diagnoses       Encounter for preventative adult health care exam with abnormal findings    -  Primary   Relevant Orders   TSH   Comprehensive metabolic panel   CBC      Return in about 1 year (around 03/25/2024) for CPE (fasting).     Kathrene Parents, NP

## 2023-03-27 ENCOUNTER — Encounter: Payer: Self-pay | Admitting: Nurse Practitioner

## 2023-03-30 LAB — POCT INR: INR: 2.7 — AB (ref 0.80–1.20)

## 2023-04-02 ENCOUNTER — Encounter: Payer: Self-pay | Admitting: Nurse Practitioner

## 2023-05-02 LAB — POCT INR: INR: 2.7 — AB (ref 0.80–1.20)

## 2023-05-29 ENCOUNTER — Other Ambulatory Visit: Payer: Self-pay | Admitting: Nurse Practitioner

## 2023-05-29 LAB — POCT INR: INR: 2.3 — AB (ref 0.80–1.20)

## 2023-05-31 ENCOUNTER — Other Ambulatory Visit: Payer: Self-pay | Admitting: Nurse Practitioner

## 2023-05-31 DIAGNOSIS — Z86711 Personal history of pulmonary embolism: Secondary | ICD-10-CM

## 2023-08-19 ENCOUNTER — Encounter: Payer: Self-pay | Admitting: Nurse Practitioner

## 2023-08-19 DIAGNOSIS — Z86711 Personal history of pulmonary embolism: Secondary | ICD-10-CM

## 2023-08-20 NOTE — Telephone Encounter (Signed)
 Medication: Warfarin (Coumadin ) 3 mg  Directions: TAKE 3 TABS ON MON, WED, FRI, AND SAT. 2 & 1/2 TAB ON TUES, THURS, AND SUN  Last given: 06/03/23 Number refills: 1 Last o/v: 03/26/23 Follow up: 1 year-SCHEDULED for 03/28/24 Labs: 05/29/23 (INR)

## 2023-08-21 MED ORDER — WARFARIN SODIUM 3 MG PO TABS: ORAL_TABLET | ORAL | 1 refills | Status: DC

## 2023-09-13 ENCOUNTER — Ambulatory Visit: Payer: Self-pay | Admitting: Medical

## 2023-09-13 NOTE — Progress Notes (Deleted)
  Cardiology Office Note   Date:  09/13/2023  ID:  Theresa Riley, DOB 1975-12-28, MRN [id number] PCP: Katheen Roselie Rockford, NP  [facility] Providers Cardiologist:  None { Click to update primary MD,subspecialty MD or APP then REFRESH:1}    History of Present Illness Theresa Riley is a 47 y.o. female with a hx of pulmonary embolism 2014 and 2017 on warfarin, OSA, chronic lower extremity swelling, obesity who presents for follow-up.    Echo from 2017 at [facility] showed normal LVEF, normal RV size, PA pressure 35-40.   Patient was last seen 11/02/2021 and was overall doing well from a cardiac perspective.  ROS: ***  Studies Reviewed      *** Risk Assessment/Calculations {Does this patient have ATRIAL FIBRILLATION?:[phone]} No BP recorded.  {Refresh Note OR Click here to enter BP  :1}***       Physical Exam VS:  LMP 02/14/2019        Wt Readings from Last 3 Encounters:  03/26/23 233 lb 3.2 oz (105.8 kg)  11/14/22 228 lb 6.4 oz (103.6 kg)  05/11/22 231 lb (104.8 kg)    GEN: Well nourished, well developed in no acute distress NECK: No JVD; No carotid bruits CARDIAC: ***RRR, no murmurs, rubs, gallops RESPIRATORY:  Clear to auscultation without rales, wheezing or rhonchi  ABDOMEN: Soft, non-tender, non-distended EXTREMITIES:  No edema; No deformity   ASSESSMENT AND PLAN ***    {Are you ordering a CV Procedure (e.g. stress test, cath, DCCV, TEE, etc)?   Press F2        :[id number]}  Dispo: ***  Signed, Arly Salminen VEAR Fishman, PA-C

## 2023-09-24 ENCOUNTER — Ambulatory Visit: Payer: Self-pay | Admitting: Medical

## 2023-09-24 NOTE — Progress Notes (Deleted)
  Cardiology Office Note   Date:  09/24/2023  ID:  Legacy Carrender, DOB [birth year], MRN [id number] PCP: Katheen Roselie Rockford, NP  [facility] Providers Cardiologist:  None { Click to update primary MD,subspecialty MD or APP then REFRESH:1}    History of Present Illness Kelleen Mahn is a 48 y.o. female with a hx of pulmonary embolism 2014 and 2017 on warfarin, OSA, chronic lower extremity swelling, obesity who presents for follow-up.    Echo from 2017 at [facility] showed normal LVEF, normal RV size, PA pressure 35-40.   The patient was last seen 11/01/2021 and is overall doing well from a cardiac perspective.  Echocardiogram was ordered for murmur.  Echocardiogram showed normal pulm function, grade 2 diastolic dysfunction, mildly elevated pulmonary artery systolic pressure, mild MR.  Today,  ROS: ***  Studies Reviewed      *** Risk Assessment/Calculations {Does this patient have ATRIAL FIBRILLATION?:[phone]} No BP recorded.  {Refresh Note OR Click here to enter BP  :1}***       Physical Exam VS:  LMP 02/14/2019        Wt Readings from Last 3 Encounters:  03/26/23 233 lb 3.2 oz (105.8 kg)  11/14/22 228 lb 6.4 oz (103.6 kg)  05/11/22 231 lb (104.8 kg)    GEN: Well nourished, well developed in no acute distress NECK: No JVD; No carotid bruits CARDIAC: ***RRR, no murmurs, rubs, gallops RESPIRATORY:  Clear to auscultation without rales, wheezing or rhonchi  ABDOMEN: Soft, non-tender, non-distended EXTREMITIES:  No edema; No deformity   ASSESSMENT AND PLAN ***    {Are you ordering a CV Procedure (e.g. stress test, cath, DCCV, TEE, etc)?   Press F2        :[id number]}  Dispo: ***  Signed, Fatma Rutten VEAR Fishman, PA-C

## 2023-10-01 ENCOUNTER — Telehealth: Payer: Self-pay

## 2023-10-01 NOTE — Telephone Encounter (Signed)
-----   Message from [facility] sent at 10/01/2023  2:47 PM EDT -----  INR remains at goal Maintain coumadin  dose.  CN ----- Message ----- From: Kym Karna CROME, CMA Sent: 10/01/2023   9:49 AM EDT To: Roselie Bishop Mood, NP   ----- Message ----- From: Lyndee Roes D Sent: 10/01/2023   7:39 AM EDT To: [facility] Team  Please abstract and route to provider.

## 2023-10-01 NOTE — Telephone Encounter (Signed)
The patient has been notified of the result and verbalized understanding.  All questions (if any) were answered.     

## 2023-10-08 ENCOUNTER — Encounter: Payer: Self-pay | Admitting: Nurse Practitioner

## 2023-10-12 ENCOUNTER — Encounter: Payer: Self-pay | Admitting: Nurse Practitioner

## 2023-10-16 ENCOUNTER — Encounter: Payer: Self-pay | Admitting: Nurse Practitioner

## 2023-10-16 ENCOUNTER — Ambulatory Visit (INDEPENDENT_AMBULATORY_CARE_PROVIDER_SITE_OTHER): Payer: PRIVATE HEALTH INSURANCE | Admitting: Nurse Practitioner

## 2023-10-16 VITALS — BP 118/70 | HR 62 | Temp 97.9°F | Resp 17 | Ht 65.0 in | Wt 239.0 lb

## 2023-10-16 DIAGNOSIS — L02215 Cutaneous abscess of perineum: Secondary | ICD-10-CM | POA: Diagnosis not present

## 2023-10-16 DIAGNOSIS — M25572 Pain in left ankle and joints of left foot: Secondary | ICD-10-CM | POA: Diagnosis not present

## 2023-10-16 NOTE — Patient Instructions (Addendum)
 Use voltaren gel 2-3x/day in left ankle Start daily ankle exercise for next 6weeks Use an ankle brace dailyx 2weeks, then stop.  Ankle Sprain, Phase I Rehab An ankle sprain is an injury to the tissues that connect bone to bone (ligaments) in your ankle. Ankle sprains can cause stiffness, loss of motion, and loss of strength. Ask your health care provider which exercises are safe for you. Do exercises exactly as told by your provider and adjust them as directed. It is normal to feel mild stretching, pulling, tightness, or discomfort as you do these exercises. Stop right away if you feel sudden pain or your pain gets worse. Do not begin these exercises until told by your provider. Stretching and range-of-motion exercises These exercises warm up your muscles and joints. They can improve the movement and flexibility of your lower leg and ankle. They also help to relieve pain and stiffness. Gastroc and soleus stretch This exercise is also called a calf stretch. It stretches the muscles in the back of the lower leg. These muscles are the gastrocnemius, or gastroc, and the soleus. Sit on the floor with your left / right leg extended. Loop a belt or towel around the ball of your left / right foot. The ball of your foot is on the walking surface, right under your toes. Keep your left / right ankle and foot relaxed and keep your knee straight. Use the belt or towel to pull your foot toward you. You should feel a gentle stretch behind your calf or knee in your gastroc muscle. Hold this position for __________ seconds, then release to the starting position. Repeat the exercise with your knee bent. You can put a pillow or a rolled bath towel under your knee to support it. You should feel a stretch deep in your calf in the soleus muscle or at your Achilles tendon. Repeat __________ times. Complete this exercise __________ times a day. Ankle alphabet  Sit with your left / right leg supported at the lower leg. Do  not rest your foot on anything. Make sure your foot has room to move freely. Think of your left / right foot as a paintbrush. Move your foot to trace each letter of the alphabet in the air. Keep your hip and knee still while you trace. Make the letters as large as you can without feeling discomfort. Trace every letter from A to Z. Repeat __________ times. Complete this exercise __________ times a day. Strengthening exercises These exercises build strength and endurance in your ankle and lower leg. Endurance is the ability to use your muscles for a long time, even after they get tired. Ankle dorsiflexion  Secure a rubber exercise band or tube to an object, such as a table leg, that will stay still when the band is pulled. Secure the other end around your left / right foot. Sit on the floor facing the object, with your left / right leg extended. The band or tube should be slightly tense when your foot is relaxed. Slowly bring your foot toward you, bringing the top of your foot toward your shin (dorsiflexion), and pulling the band tighter. Hold this position for __________ seconds. Slowly return your foot to the starting position. Repeat __________ times. Complete this exercise __________ times a day. Ankle plantar flexion  Sit on the floor with your left / right leg extended. Loop a rubber exercise tube or band around the ball of your left / right foot. The ball of your foot is on the  walking surface, right under your toes. Hold the ends of the band or tube in your hands. The band or tube should be slightly tense when your foot is relaxed. Slowly point your foot and toes downward to tilt the top of your foot away from your shin (plantar flexion). Hold this position for __________ seconds. Slowly return your foot to the starting position. Repeat __________ times. Complete this exercise __________ times a day. Ankle eversion  Sit on the floor with your legs straight out in front of  you. Loop a rubber exercise band or tube around the ball of your left / right foot. The ball of your foot is on the walking surface, right under your toes. Hold the ends of the band in your hands or secure the band to a stable object. The band or tube should be slightly tense when your foot is relaxed. Slowly push your foot outward, away from your other leg (eversion). Hold this position for __________ seconds. Slowly return your foot to the starting position. Repeat __________ times. Complete this exercise __________ times a day. This information is not intended to replace advice given to you by your health care provider. Make sure you discuss any questions you have with your health care provider. Document Revised: 11/23/2021 Document Reviewed: 11/23/2021 Elsevier Patient Education  2024 [employer]  Ankle Sprain, Phase II Rehab An ankle sprain is an injury to tissue that connects bone to bone (a ligament) in the ankle. Ankle sprains can cause stiffness, loss of motion, and loss of strength. Ask your health care provider which exercises are safe for you. Do exercises exactly as told by your provider and adjust them as directed. It is normal to feel mild stretching, pulling, tightness, or discomfort as you do these exercises. Stop right away if you feel sudden pain or your pain gets worse. Do not begin these exercises until told by your provider. Stretching and range-of-motion exercises These exercises warm up your muscles and joints. They can improve the movement and flexibility of your lower leg and ankle. They also help to relieve pain and stiffness. Standing gastroc stretch This exercise is also called a standing calf (gastroc) stretch. Stand with your hands against a wall. Extend your left / right leg behind you. Bend your front knee slightly. Your heels should be on the floor. Keeping your heels on the floor and your back knee straight, shift your weight toward the wall. You should feel  a gentle stretch in the back of your lower leg (calf). Hold this position for __________ seconds. Repeat __________ times. Complete this exercise __________ times a day. Standing soleus stretch This exercise is also called a standing calf (soleus) stretch. Stand with your hands against a wall. Extend your left / right leg behind you. Bend your front knee slightly. Both of your heels should be on the floor. Keeping your heels on the floor, bend your back knee and shift your weight slightly over your back leg. You should feel a gentle stretch deep in your calf. Hold this position for __________ seconds. Repeat __________ times. Complete this exercise __________ times a day. Strengthening exercises These exercises build strength and endurance in your lower leg. Endurance is the ability to use your muscles for a long time, even after they get tired. Heel walking  This exercise is sometimes called dorsiflexion. Walk on your heels for __________ seconds or ___________ steps. Keep your toes as high as possible. Repeat __________ times. Complete this exercise __________ times a day.  Balance exercises These exercises improve your balance and the reaction and control of your ankle. They help to improve stability. Multi-angle lunge  Stand with your feet together. Take a step forward with your left / right leg. Shift your weight onto that leg. Your back heel will come off the floor and your back toes will stay in place. Push off your front leg to return your front foot to the starting position next to your other foot. Repeat to the side, to the back, and any other directions as told by your provider. Repeat __________ times. Complete this exercise __________ times a day. Single leg stand If this exercise is too easy, you can try it with your eyes closed or while standing on a pillow. Without shoes, stand near a railing or in a doorframe. Hold on to the railing or doorframe as needed. Let loose of the  railing or doorframe as you are able. Stand on your left / right foot. Keep your big toe down on the floor and try to keep your arch lifted. Hold this position for __________ seconds. Repeat __________ times. Complete this exercise __________ times a day. Ankle inversion and eversion This exercise is also called foot rotation with a balance board. It uses a balance board to rotate the foot and ankle inward (inversion) and outward (eversion). Ask your provider where you can get a balance board or how you can make one. Stand on a non-carpeted surface near a countertop or wall. Step onto the balance board so your feet are hip width apart. Keep your feet in place. Keep your upper body and hips steady. Using only your feet and ankles to move the board, do these exercises as told by your provider: Tip the board side to side as far as you can, switching between tipping to the left and tipping to the right. Tip the board so it silently taps the floor. Do not let the board hit the floor with force. From time to time, pause to hold a steady midway position, with neither the right nor the left sides touching the ground. Tip the board side to side so it does not hit the floor at all. From time to time, pause to hold a steady midway position. Repeat __________ times. Complete this exercise __________ times a day. Ankle plantar flexion and dorsiflexion This exercise is also called foot flexion with a balance board. It uses a balance board to push the foot downward and away from the leg (plantar flexion) or upward and toward the leg (dorsiflexion). Ask your provider where you can get a balance board or how you can make one. Stand on a non-carpeted surface near a countertop or wall. Step onto the balance board so your feet are hip width apart. Keep your feet in place. Keep your upper body and hips steady. Using only your feet and ankles to move the board, do one or both of these exercises as told by your  provider: Tip the board forward and backward so it silently taps the floor. Do not let the board hit the floor with force. From time to time, pause to hold a steady position midway between touching the floor in front and touching the floor in back. Tip the board forward and backward so it does not hit the floor at all. From time to time, pause to hold a steady position in the middle. Repeat __________ times. Complete this exercise __________ times a day. This information is not intended to replace advice given  to you by your health care provider. Make sure you discuss any questions you have with your health care provider. Document Revised: 11/23/2021 Document Reviewed: 11/23/2021 Elsevier Patient Education  2024 [employer].

## 2023-10-16 NOTE — Progress Notes (Signed)
   Acute Office Visit  Subjective:    Patient ID: Theresa Riley, female    DOB: Dec 09, 1975, 48 y.o.   MRN: [id number]  Chief Complaint  Patient presents with   Acute Visit    Abscess recheck, left ankle swelling, finished with antibiotic - does she need more?     HPI Patient is in today for re eval of perineal abscess and left ankle pain. She was seen by ED provider and GYN last week. She has completed oral doxycycline x 7days. She states pain and drainage has resolved. She denies any impact on INR. Left ankle pain x 2weeks, constant, mild arch, unknown injury, worse with medial movement of ankle and prolonged immobility, no issue with weight bearing activity. Associated with mild swelling. No paresthesia.  Outpatient Medications Prior to Visit  Medication Sig   acetaZOLAMIDE  ER (DIAMOX ) 500 MG capsule Take 1 capsule by mouth 2 (two) times daily.   Ascorbic Acid (VITAMIN C) 1000 MG tablet Take 1,000 mg by mouth daily.   Cholecalciferol (DIALYVITE VITAMIN D 5000 PO) Take 5,000 Units by mouth daily.   warfarin (COUMADIN ) 3 MG tablet TAKE 3 TABS ON MON, WED, FRI, AND SAT. 2 & 1/2 TAB ON TUES, THURS, AND SUN.   [DISCONTINUED] vitamin B-12 (CYANOCOBALAMIN) 1000 MCG tablet Take 1,000 mcg by mouth daily. (Patient not taking: Reported on 10/16/2023)   No facility-administered medications prior to visit.    Reviewed past medical and social history.  Review of Systems Per HPI     Objective:    Physical Exam Vitals reviewed.  Genitourinary:    General: Normal vulva.     Labia:        Right: No lesion.        Left: No lesion.      Comments: No induration or fluctuance or erythema or drainage noted Musculoskeletal:     Left ankle: Tenderness present over the AITF ligament. No lateral malleolus, medial malleolus, ATF ligament, CF ligament, posterior TF ligament, base of 5th metatarsal or proximal fibula tenderness. Anterior drawer test negative. Normal pulse.     Left Achilles Tendon:  Normal.     Left foot: Normal.  Neurological:     Mental Status: She is alert.    BP 118/70   Pulse 62   Temp 97.9 F (36.6 C)   Resp 17   Ht 5' 5 (1.651 m)   Wt 239 lb (108.4 kg)   LMP 02/14/2019   SpO2 99%   BMI 39.77 kg/m    No results found for any visits on 10/16/23.     Assessment & Plan:   Problem List Items Addressed This Visit   None Visit Diagnoses       Acute left ankle pain    -  Primary     Perineal abscess, superficial         Advised to Use voltaren gel 2-3x/day in left ankle Start daily ankle exercise for next 6weeks Use an ankle brace dailyx 2weeks, then stop. Consider referral to podiatry if no improvement in 6weeks.  No orders of the defined types were placed in this encounter.  Return if symptoms worsen or fail to improve.    Roselie Mood, NP

## 2023-10-30 ENCOUNTER — Ambulatory Visit: Payer: PRIVATE HEALTH INSURANCE | Attending: Medical | Admitting: Medical

## 2023-10-30 NOTE — Progress Notes (Deleted)
  Cardiology Office Note   Date:  10/30/2023  ID:  Caylee Vlachos, DOB 1975-05-14, MRN [id number] PCP: Katheen Roselie Rockford, NP  [facility] Providers Cardiologist:  None { Click to update primary MD,subspecialty MD or APP then REFRESH:1}    History of Present Illness Ameirah Kingma is a 48 y.o. female with a hx of pulmonary embolism 2014 and 2017 on warfarin, OSA, chronic lower extremity swelling, obesity who presents for follow-up.    Echo from 2017 at [facility] showed normal LVEF, normal RV size, PA pressure 35-40.   Patient was last seen 11/01/2021 and was overall doing well.  Today,   ROS: ***  Studies Reviewed      *** Risk Assessment/Calculations {Does this patient have ATRIAL FIBRILLATION?[phone]} No BP recorded.  {Refresh Note OR Click here to enter BP  :1}***       Physical Exam VS:  LMP 02/14/2019        Wt Readings from Last 3 Encounters:  10/16/23 239 lb (108.4 kg)  03/26/23 233 lb 3.2 oz (105.8 kg)  11/14/22 228 lb 6.4 oz (103.6 kg)    GEN: Well nourished, well developed in no acute distress NECK: No JVD; No carotid bruits CARDIAC: ***RRR, no murmurs, rubs, gallops RESPIRATORY:  Clear to auscultation without rales, wheezing or rhonchi  ABDOMEN: Soft, non-tender, non-distended EXTREMITIES:  No edema; No deformity   ASSESSMENT AND PLAN ***    {Are you ordering a CV Procedure (e.g. stress test, cath, DCCV, TEE, etc)?   Press F2        :[id number]}  Dispo: ***  Signed, Magnolia Mattila VEAR Fishman, PA-C

## 2023-11-13 NOTE — Progress Notes (Unsigned)
 Patient: Theresa Riley Date of Birth: [birth year]  Reason for Visit: Follow up for CPAP History from: Patient Primary Neurologist: Theresa Riley   Virtual Visit via Video Note  I connected with Theresa Riley on 11/14/23 at  2:15 PM EDT by a video enabled telemedicine application and verified that I am speaking with the correct person using two identifiers.  Location: Patient: at her home Provider: in the office    I discussed the limitations of evaluation and management by telemedicine and the availability of in person appointments. The patient expressed understanding and agreed to proceed.  ASSESSMENT AND PLAN 48 y.o. year old female   1.  OSA on CPAP (setup May 2023)  -Excellent CPAP compliance.  Continue current settings.  Continue nightly usage minimum 4 hours.  Continue to replace supplies routinely through DME.  Follow-up in 1 year virtually.  HISTORY OF PRESENT ILLNESS: Today 11/14/23 11/14/23 SS: Here via VV.  CPAP report shows 100% compliance, greater than 4 hours 97%.  6 to 16 cm water.  AHI 0.6, leak 0.5. Doing great with CPAP. Sleeps through the night, good energy. Uses nasal mask. 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   11/14/22 SS: Last saw Dr. Chalice in July 2023.  HST May 2023 showed severe sleep apnea.  Set up date May 2023. Using nasal mask, FFM felt too big. Using chin strap. Sleeps well, feel more energized during the day, sleeping through the night, may get up once at night but was doing 4-5 times at night. Feels getting enough pressure at night. Had been trying to sleep on her back to help with her back from computer strain, noticed her AHI went up on her machine. Is usually a side sleeper. Going to [place] from IIH, on Diamox . Reports tried to come off, then had return of papilledema. Works as Public affairs consultant. AHI 0.7, leak 3.2. 100% compliance 30/30 days, > 4 hours 93%. On coumadin  for history of PE.  HISTORY  Theresa Riley is a 48 y.o.  African American female patient  seen here in a RV on 08/30/2021, The patient underwent a home sleep test on 5-3 2023 based on a history of blood clotting disorder pulmonary emboli and suspicion of OSA.  She had a sleep study in 2021 in [place]  diagnosed in [facility] with severe sleep apnea was diagnosed with a REM AHI of 42.1.  Epworth sleepiness score was endorsed at 13 points.   06-15-2021: The watch pat home sleep test confirmed an AHI of 32/h interestingly her non-REM sleep apnea here was higher than her REM sleep apnea.   There was also a significant positional dependency.  The patient slept mainly in supine position where she reached an AHI apnea hypopnea index of 50.7/h when she slept on her right side her AHI was 0.9/h.  She had 30 minutes of desaturation with a nadir of 77%.  And I ordered an auto titrating CPAP between 6 and 16 cm pressure window 3 cm expiratory pressure relief at the interface of patient's choice.  The patient presents with her new machine and is highly compliant excellent compliance of 100% by days and hours average user time 7 hours 18 minutes.  Residual AHI is 1.0/h and the 95th percentile pressure is 15.5 cm.  Minimal air leakage which means that the interface is fitting her very well.   She feels refreshed in AM, and her Epworth score is now 5 / 24 points - down from 13/ 24 points.  Nocturia 4-5 times is now  reduced to one time at night.    Original referral from NP Nche for a Sleep Consultation, MD.  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  She  has a past medical history of Pulmonary emboli (2014- 2017 / on warfarin)HCC) and Yeast infection.     The patient had the first sleep study in the year 2021. Internal referral for OSA. Pt just moved from [facility] in 2021. Had previously sleep study around 2021.  Dx with severe sleep apnea at that time. Was not able to  complete titration study d/t insurance issues, never set up with CPAP.    Outside PSG from [place] , [place]: [id number];  The patient was seen at the [facility] for sleep disorders, referring physician was Theresa Riley.  She was diagnosed with an overall AHI of 20.9 and REM AHI of 42.1/h non-REM sleep AHI was 14.8/h.  There were no significant periodic limb movements or snoring arousals.  She had 1 central apnea all other events were obstructive but the vast majority was hypopnea.  Heart rate was in normal variability, she slept supine only.  Her hypnogram shows 3 distinct REM sleep cycles, in rem sleep she had 14 desaturations per hour of REM sleep.  Nadir was 74%.   Sleep relevant medical history: Nocturia 3-5 times at night- fragmented sleep. Every time I roll over .has enuresis.  No TBI no ENT surgery. Wisdom teeth 4 removed.    Family medical /sleep history: mother is likely undiagnosed,  no other family member on CPAP with OSA.  Social history:  Patient is working as Public affairs consultant,  [employer]  2004 graduate, and lives in a household with  mother.  Family status is single.  The patient currently works full time, part from home, Tobacco use;none .  ETOH use ; rare ,  Caffeine intake ; none    Sleep habits are as follows: The patient's dinner time is between 8-9 PM. The patient goes to bed at 11-12 PM and continues to sleep for intervals 1-2 hours, wakes for many, many bathroom breaks.   The preferred sleep position is supine but she wakes up snoring, choking, , with the support of 1-pillow. Dreams are reportedly frequent/vivid.  7.30 AM is the usual rise time. The patient wakes up spontaneously with an alarm at 6.00.  She reports sometimes  not feeling refreshed or restored in AM. Naps are taken frequently, dozing off in meetings.   REVIEW OF SYSTEMS: Out of a complete 14 system review of symptoms, the patient complains only of the following symptoms, and all other reviewed  systems are negative.  See HPI  ALLERGIES: No Known Allergies  HOME MEDICATIONS: Outpatient Medications Prior to Visit  Medication Sig Dispense Refill   acetaZOLAMIDE  ER (DIAMOX ) 500 MG capsule Take 1 capsule by mouth 2 (two) times daily.     Ascorbic Acid (VITAMIN C) 1000 MG tablet Take 1,000 mg by mouth daily.     Cholecalciferol (DIALYVITE VITAMIN D 5000 PO) Take 5,000 Units by mouth daily.     warfarin (COUMADIN ) 3 MG tablet TAKE 3 TABS ON MON, WED, FRI, AND SAT. 2 & 1/2 TAB ON TUES, THURS, AND SUN. 234 tablet 1   No facility-administered medications prior to visit.    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   Pulmonary emboli (HCC)    Sleep apnea    Yeast infection     PAST SURGICAL HISTORY: Past Surgical History:  Procedure Laterality Date   WISDOM TOOTH EXTRACTION     x4    FAMILY HISTORY: Family History  Problem Relation Age of Onset   Diabetes Mother    Dementia Mother    Seizures Mother    Pulmonary embolism Brother    Deep vein thrombosis Brother     SOCIAL HISTORY: Social History   Socioeconomic History   Marital status: Single    Spouse name: Not on file   Number of children: 2   Years of education: Not on file   Highest education level: Master's degree (e.g., MA, MS, MEng, MEd, MSW, MBA)  Occupational History   Not on file  Tobacco Use   Smoking status: Never   Smokeless tobacco: Never  Vaping Use   Vaping status: Never Used  Substance and Sexual Activity   Alcohol use: Never   Drug use: Never   Sexual activity: Not Currently    Birth control/protection: Abstinence  Other Topics Concern   Not on file  Social History Narrative   No caffeine use   Right Handed    Lives in a two story home    Social Drivers of Health   Financial Resource Strain: Low Risk  (10/15/2023)   Overall Financial Resource Strain (CARDIA)     Difficulty of Paying Living Expenses: Not very hard  Food Insecurity: No Food Insecurity (10/15/2023)   Hunger Vital Sign    Worried About Running Out of Food in the Last Year: Never true    Ran Out of Food in the Last Year: Never true  Transportation Needs: No Transportation Needs (10/15/2023)   PRAPARE - Administrator, Civil Service (Medical): No    Lack of Transportation (Non-Medical): No  Physical Activity: Insufficiently Active (10/15/2023)   Exercise Vital Sign    Days of Exercise per Week: 1 day    Minutes of Exercise per Session: 40 min  Stress: No Stress Concern Present (10/15/2023)   [employer] of Occupational Health - Occupational Stress Questionnaire    Feeling of Stress: Only a little  Social Connections: Moderately Integrated (10/15/2023)   Social Connection and Isolation Panel    Frequency of Communication with Friends and Family: Three times a week    Frequency of Social Gatherings with Friends and Family: Once a week    Attends Religious Services: More than 4 times per year    Active Member of [employer] or Organizations: Yes    Attends Engineer, structural: More than 4 times per year    Marital Status: Never married  Intimate Partner Violence: Not on file   PHYSICAL EXAM  There were no vitals filed for this visit.  There is no height or weight on file to calculate BMI.  Virtual Visit   DIAGNOSTIC DATA (LABS, IMAGING, TESTING) - I reviewed patient records, labs, notes, testing and imaging myself where available.  Lab Results  Component Value Date   WBC 4.2 03/26/2023   HGB 12.2 03/26/2023   HCT 37.8 03/26/2023   MCV 98.9 03/26/2023   PLT 280.0 03/26/2023      Component Value Date/Time   NA 141 03/26/2023 1209   K 3.6 03/26/2023 1209   CL 109 03/26/2023 1209   CO2 25 03/26/2023 1209   GLUCOSE 94  03/26/2023 1209   BUN 9 03/26/2023 1209   CREATININE 0.79 03/26/2023 1209   CALCIUM 8.9 03/26/2023 1209   PROT 7.4 03/26/2023 1209    ALBUMIN 4.2 03/26/2023 1209   AST 19 03/26/2023 1209   ALT 16 03/26/2023 1209   ALKPHOS 91 03/26/2023 1209   BILITOT 0.3 03/26/2023 1209   GFRNONAA >60 12/19/2019 1343   GFRAA >60 09/23/2018 1203   Lab Results  Component Value Date   CHOL 152 03/26/2023   HDL 42.90 03/26/2023   LDLCALC 93 03/26/2023   TRIG 84.0 03/26/2023   CHOLHDL 4 03/26/2023   No results found for: HGBA1C No results found for: [id number] Lab Results  Component Value Date   TSH 0.68 03/26/2023   Lauraine Born, AGNP-C, DNP 11/14/2023, 2:25 PM [facility] [address], [address] [place], [place] [address] [phone]

## 2023-11-14 ENCOUNTER — Telehealth: Payer: PRIVATE HEALTH INSURANCE | Admitting: Neurology

## 2023-11-14 DIAGNOSIS — G4733 Obstructive sleep apnea (adult) (pediatric): Secondary | ICD-10-CM

## 2023-11-14 NOTE — Patient Instructions (Signed)
 Great to see you today! Continue CPAP usage minimum 4 hours nightly Continue current settings Continue to replace supplies routinely through DME Follow-up in 1 year or sooner if needed. Thanks!!

## 2023-11-30 ENCOUNTER — Ambulatory Visit: Payer: PRIVATE HEALTH INSURANCE | Admitting: Medical

## 2023-12-14 ENCOUNTER — Ambulatory Visit: Payer: PRIVATE HEALTH INSURANCE | Admitting: Medical

## 2023-12-14 NOTE — Progress Notes (Deleted)
  Cardiology Office Note   Date:  12/14/2023  ID:  Theresa Riley, DOB [birth year], MRN [id number] PCP: Theresa Roselie Rockford, NP  [facility] Providers Cardiologist:  None { Click to update primary MD,subspecialty MD or APP then REFRESH:1}    History of Present Illness Theresa Riley is a 48 y.o. female with a hx of pulmonary embolism 2014 and 2017 on warfarin, OSA, chronic lower extremity swelling, obesity who presents for follow-up.    Echo from 2017 at [facility] showed normal LVEF, normal RV size, PA pressure 35-40.   Patient was last seen 11/01/2021 and is overall doing well from a cardiac perspective.  Echocardiogram was ordered for murmur.  Echo showed LVEF 55 to 60%, grade 2 diastolic dysfunction, mild MR.  ROS: ***  Studies Reviewed      *** Risk Assessment/Calculations {Does this patient have ATRIAL FIBRILLATION?[phone]} No BP recorded.  {Refresh Note OR Click here to enter BP  :1}***       Physical Exam VS:  LMP 02/14/2019        Wt Readings from Last 3 Encounters:  10/16/23 239 lb (108.4 kg)  03/26/23 233 lb 3.2 oz (105.8 kg)  11/14/22 228 lb 6.4 oz (103.6 kg)    GEN: Well nourished, well developed in no acute distress NECK: No JVD; No carotid bruits CARDIAC: ***RRR, no murmurs, rubs, gallops RESPIRATORY:  Clear to auscultation without rales, wheezing or rhonchi  ABDOMEN: Soft, non-tender, non-distended EXTREMITIES:  No edema; No deformity   ASSESSMENT AND PLAN ***    {Are you ordering a CV Procedure (e.g. stress test, cath, DCCV, TEE, etc)?   Press F2        :[id number]}  Dispo: ***  Signed, Theresa Axe VEAR Fishman, PA-C

## 2023-12-27 ENCOUNTER — Encounter: Payer: Self-pay | Admitting: Medical

## 2023-12-27 ENCOUNTER — Ambulatory Visit: Attending: Medical | Admitting: Medical

## 2023-12-27 VITALS — BP 148/100 | HR 60 | Ht 65.0 in | Wt 242.5 lb

## 2023-12-27 DIAGNOSIS — I5032 Chronic diastolic (congestive) heart failure: Secondary | ICD-10-CM

## 2023-12-27 DIAGNOSIS — I2699 Other pulmonary embolism without acute cor pulmonale: Secondary | ICD-10-CM

## 2023-12-27 DIAGNOSIS — I872 Venous insufficiency (chronic) (peripheral): Secondary | ICD-10-CM

## 2023-12-27 NOTE — Progress Notes (Signed)
 Cardiology Office Note   Date:  12/27/2023  ID:  Theresa Riley, DOB 1975/03/05, MRN [id number] PCP: Katheen Roselie Rockford, NP  [facility] Providers Cardiologist:  None     History of Present Illness Theresa Riley is a 48 y.o. female with a hx of pulmonary embolism 2014 and 2017 on warfarin, OSA, chronic lower extremity swelling, obesity who presents for follow-up.    Echo from 2017 at [facility] showed normal LVEF, normal RV size, PA pressure 35-40.   Patient was last seen September 2023 and was overall doing well from cardiac perspective.  Echocardiogram was ordered for murmur.  This showed pulm function 55 to 60%, grade 2 diastolic dysfunction, normal RV function, mild MR.  Today, the patient is overall doing well.  She denies chest pain, SOB. She has occasional dependent edema. No lightheadedness, dizziness, or palpitations. PCP manages her INR.   Studies Reviewed EKG Interpretation Date/Time:  Thursday December 27 2023 15:25:26 EST Ventricular Rate:  60 PR Interval:  168 QRS Duration:  78 QT Interval:  354 QTC Calculation: 354 R Axis:   -5  Text Interpretation: Normal sinus rhythm Minimal voltage criteria for LVH, may be normal variant ( R in aVL ) Nonspecific T wave abnormality When compared with ECG of 19-Dec-2019 22:12, QRS axis Shifted left Criteria for Lateral infarct are no longer Present Criteria for Inferior infarct are no longer Present T wave inversion no longer evident in Lateral leads QT has shortened Confirmed by Franchester, Jacqlyn Marolf ([id number]) on 12/27/2023 3:30:51 PM    Echo 11/2021 1. Left ventricular ejection fraction, by estimation, is 55 to 60%. The  left ventricle has normal function. The left ventricle has no regional  wall motion abnormalities. Left ventricular diastolic parameters are  consistent with Grade II diastolic  dysfunction (pseudonormalization). The average left ventricular global  longitudinal strain is -18.5 %.   2. Right ventricular  systolic function is normal. The right ventricular  size is normal. There is mildly elevated pulmonary artery systolic  pressure. The estimated right ventricular systolic pressure is 38.2 mmHg.   3. Left atrial size was mildly dilated.   4. The mitral valve is normal in structure. Mild mitral valve  regurgitation. No evidence of mitral stenosis.   5. The aortic valve is tricuspid. Aortic valve regurgitation is not  visualized. No aortic stenosis is present.   6. The inferior vena cava is normal in size with greater than 50%  respiratory variability, suggesting right atrial pressure of 3 mmHg.   Physical Exam VS:  BP (!) 148/100 (BP Location: Left Arm, Patient Position: Sitting, Cuff Size: Large)   Pulse 60   Ht 5' 5 (1.651 m)   Wt 242 lb 8 oz (110 kg)   LMP 02/14/2019   SpO2 98%   BMI 40.35 kg/m        Wt Readings from Last 3 Encounters:  12/27/23 242 lb 8 oz (110 kg)  10/16/23 239 lb (108.4 kg)  03/26/23 233 lb 3.2 oz (105.8 kg)    GEN: Well nourished, well developed in no acute distress NECK: No JVD; No carotid bruits CARDIAC: RRR, no murmurs, rubs, gallops RESPIRATORY:  Clear to auscultation without rales, wheezing or rhonchi  ABDOMEN: Soft, non-tender, non-distended EXTREMITIES:  No edema; No deformity   ASSESSMENT AND PLAN  H/o recurrent pulmonary embolism in 2014 and 2017 She is on long-term warfarin. INR is monitor by her PCP. She sees hematology yearly and no hypercoagulable disease was found. Continue warfarin.   Dependent  edema Venous insufficiency She has intermittent edema. She wears compression socks, eats low salt and elevates her feet. Echo in 2023 showed normal LVEF 55-60%, no WMA, G2DD, mild MR.     Dispo: follow-up in 1 year  Signed, Jamaira Sherk VEAR Fishman, PA-C

## 2023-12-27 NOTE — Patient Instructions (Signed)
 Medication Instructions:  Your physician recommends that you continue on your current medications as directed. Please refer to the Current Medication list given to you today.   *If you need a refill on your cardiac medications before your next appointment, please call your pharmacy*  Lab Work: No labs ordered today  If you have labs (blood work) drawn today and your tests are completely normal, you will receive your results only by: MyChart Message (if you have MyChart) OR A paper copy in the mail If you have any lab test that is abnormal or we need to change your treatment, we will call you to review the results.  Testing/Procedures: No test ordered today   Follow-Up: At [facility], you and your health needs are our priority.  As part of our continuing mission to provide you with exceptional heart care, our providers are all part of one team.  This team includes your primary Cardiologist (physician) and Advanced Practice Providers or APPs (Physician Assistants and Nurse Practitioners) who all work together to provide you with the care you need, when you need it.  Your next appointment:   1 year(s)  Provider:   Cadence Franchester, PA-C    We recommend signing up for the patient portal called MyChart.  Sign up information is provided on this After Visit Summary.  MyChart is used to connect with patients for Virtual Visits (Telemedicine).  Patients are able to view lab/test results, encounter notes, upcoming appointments, etc.  Non-urgent messages can be sent to your provider as well.   To learn more about what you can do with MyChart, go to [email or web address].

## 2024-01-30 ENCOUNTER — Other Ambulatory Visit: Payer: Self-pay | Admitting: Nurse Practitioner

## 2024-01-30 DIAGNOSIS — Z86711 Personal history of pulmonary embolism: Secondary | ICD-10-CM

## 2024-02-11 ENCOUNTER — Encounter: Payer: Self-pay | Admitting: Nurse Practitioner

## 2024-02-11 DIAGNOSIS — Z1231 Encounter for screening mammogram for malignant neoplasm of breast: Secondary | ICD-10-CM

## 2024-02-12 NOTE — Telephone Encounter (Signed)
 Patient called and scheduled for office appointment with Dr. Prentiss on Friday 02/15/23.

## 2024-02-13 ENCOUNTER — Ambulatory Visit: Admitting: Family Medicine

## 2024-02-15 ENCOUNTER — Ambulatory Visit (INDEPENDENT_AMBULATORY_CARE_PROVIDER_SITE_OTHER): Admitting: Family Medicine

## 2024-02-15 VITALS — BP 140/86 | HR 70 | Ht 65.0 in | Wt 240.8 lb

## 2024-02-15 DIAGNOSIS — N6341 Unspecified lump in right breast, subareolar: Secondary | ICD-10-CM | POA: Diagnosis not present

## 2024-02-15 NOTE — Progress Notes (Signed)
 "    Patient Care Team: Nche, Roselie Rockford, NP as PCP - General (Internal Medicine) Tat, Asberry RAMAN, DO as Consulting Physician (Neurology)   Assessment & Plan:   1. Subareolar mass of right breast (Primary) - MM 3D DIAGNOSTIC MAMMOGRAM BILATERAL BREAST - US  LIMITED ULTRASOUND INCLUDING AXILLA RIGHT BREAST  49 year old female with palpable subareolar breast lump. Clinical breast exam findings are most consistent with benign fibrocystic change. No overlying skin changes, nipple retraction, or pathologic nipple discharge noted. Given patient age and presence of a palpable breast mass, further diagnostic evaluation is indicated.  Plan:  Diagnostic bilateral mammogram ordered    Targeted breast ultrasound of the area of concern ordered    Rationale: In women age >=40 with a palpable breast mass, diagnostic mammography is recommended as initial imaging, with adjunct targeted ultrasound to further characterize the lesion and improve diagnostic accuracy. Diagnostic (not screening) imaging is indicated regardless of suspected benignity on physical exam.  Counseling:  Discussed common benign etiologies of breast lumps, including fibrocystic changes    Reviewed the need for diagnostic imaging based on age and exam findings    Reviewed return precautions including rapid growth, persistent pain, skin changes, or nipple discharge    Follow-up:  Will review imaging results when available and determine next steps (routine surveillance vs breast specialist referral if indicated)    References:  American College of Radiology (ACR) Appropriateness Criteria - Palpable Breast Masses    American College of Obstetricians and Gynecologists (ACOG) Practice Bulletin - Breast Cancer Risk Assessment and Screening  Geni Shutter, DO, MS, FAAFP, Dipl. [facility] [address] [place], [address] Dept: [phone] Dept Fax: [phone]  Subjective:    Review of Systems: Negative, with the exception of above mentioned in HPI.  History:   Reviewed by clinician on day of visit: allergies, medications, problem list, medical history, surgical history, family history, social history, and previous encounter notes.  Medications:   Show/hide medication list[1] Allergies[2]  Objective:   BP (!) 140/86 (BP Location: Left Arm, Patient Position: Sitting, Cuff Size: Large)   Pulse 70   Ht 5' 5 (1.651 m)   Wt 240 lb 12.8 oz (109.2 kg)   LMP 02/14/2019   SpO2 99%   BMI 40.07 kg/m   Physical Exam Cardiovascular:     Rate and Rhythm: Normal rate.  Pulmonary:     Effort: Pulmonary effort is normal.  Chest:    Neurological:     Mental Status: She is alert.     Attestations:   Reviewed by clinician on day of visit: allergies, medications, problem list, medical history, surgical history, family history, social history, and previous encounter notes.  The patient is being seen today for an acute visit.  Geni Shutter, DO, MS, FAAFP, Dipl. [facility] [address] [place], [address] Dept: [phone] Dept Fax: [phone]     [1]  Outpatient Medications Prior to Visit  Medication Sig   acetaZOLAMIDE  ER (DIAMOX ) 500 MG capsule Take 1 capsule by mouth 2 (two) times daily.   Ascorbic Acid (VITAMIN C) 1000 MG tablet Take 1,000 mg by mouth daily.   Cholecalciferol (DIALYVITE VITAMIN D 5000 PO) Take 5,000 Units by mouth daily.   warfarin (COUMADIN ) 3 MG tablet TAKE 3 TABS ON MON, WED, FRI, AND SAT. 2 & 1/2 TAB ON TUES, THURS, AND SUN.   No facility-administered medications prior to visit.  [2]  Allergies Allergen Reactions  Iodinated Contrast Media     Nausea and vomitting   "

## 2024-03-11 ENCOUNTER — Encounter: Payer: Self-pay | Admitting: Nurse Practitioner

## 2024-03-12 ENCOUNTER — Telehealth: Payer: Self-pay

## 2024-03-12 NOTE — Telephone Encounter (Signed)
 Printed off patient requisitions to get them signed by Dr. Thedora, faxed sent over to [facility]. Nothing further needed.

## 2024-03-12 NOTE — Telephone Encounter (Signed)
 Copied from CRM [id number]. Topic: Clinical - Request for Lab/Test Order >> Mar 12, 2024  4:34 PM Drema MATSU wrote: Reason for CRM: Pt stated that the order for breast imaging was not signed and it needs to be signed before the end of day today or they will have to cancel order. Appt is tomorrow at 8:00 She stated that she last seen Dr. Prentiss since Dr. Katheen was out

## 2024-03-13 ENCOUNTER — Encounter

## 2024-03-13 ENCOUNTER — Other Ambulatory Visit

## 2024-03-13 ENCOUNTER — Ambulatory Visit
Admission: RE | Admit: 2024-03-13 | Discharge: 2024-03-13 | Disposition: A | Discharge status: Home / Self Care | Source: Ambulatory Visit | Attending: Family Medicine | Admitting: Family Medicine

## 2024-03-14 ENCOUNTER — Other Ambulatory Visit

## 2024-03-14 ENCOUNTER — Encounter

## 2024-03-28 ENCOUNTER — Encounter: Payer: Managed Care, Other (non HMO) | Admitting: Nurse Practitioner

## 2024-04-02 ENCOUNTER — Encounter

## 2024-04-02 ENCOUNTER — Other Ambulatory Visit

## 2024-11-26 ENCOUNTER — Telehealth: Payer: PRIVATE HEALTH INSURANCE | Admitting: Neurology
